# Patient Record
Sex: Male | Born: 1955
Health system: Southern US, Community
[De-identification: ages and names within clinical notes are randomized; demographics above are authoritative.]

## PROBLEM LIST (undated history)

## (undated) DIAGNOSIS — L111 Transient acantholytic dermatosis [Grover]: Secondary | ICD-10-CM

## (undated) DIAGNOSIS — M1990 Unspecified osteoarthritis, unspecified site: Secondary | ICD-10-CM

## (undated) DIAGNOSIS — Z22322 Carrier or suspected carrier of Methicillin resistant Staphylococcus aureus: Secondary | ICD-10-CM

## (undated) DIAGNOSIS — R112 Nausea with vomiting, unspecified: Secondary | ICD-10-CM

## (undated) DIAGNOSIS — K589 Irritable bowel syndrome without diarrhea: Secondary | ICD-10-CM

## (undated) DIAGNOSIS — K219 Gastro-esophageal reflux disease without esophagitis: Secondary | ICD-10-CM

## (undated) DIAGNOSIS — Z9889 Other specified postprocedural states: Secondary | ICD-10-CM

## (undated) DIAGNOSIS — F419 Anxiety disorder, unspecified: Secondary | ICD-10-CM

## (undated) HISTORY — PX: APPENDECTOMY: SHX54

## (undated) HISTORY — DX: Irritable bowel syndrome, unspecified: K58.9

## (undated) HISTORY — DX: Gastro-esophageal reflux disease without esophagitis: K21.9

## (undated) HISTORY — PX: KNEE SURGERY: SHX244

---

## 2011-08-12 HISTORY — PX: COLONOSCOPY: SHX174

## 2012-05-20 DIAGNOSIS — N50819 Testicular pain, unspecified: Secondary | ICD-10-CM | POA: Insufficient documentation

## 2014-02-28 ENCOUNTER — Ambulatory Visit: Payer: Self-pay

## 2014-03-08 ENCOUNTER — Ambulatory Visit (INDEPENDENT_AMBULATORY_CARE_PROVIDER_SITE_OTHER): Payer: BC Managed Care – PPO

## 2014-03-08 VITALS — BP 144/93 | HR 75 | Resp 17 | Ht 72.0 in | Wt 188.0 lb

## 2014-03-08 DIAGNOSIS — M778 Other enthesopathies, not elsewhere classified: Secondary | ICD-10-CM

## 2014-03-08 DIAGNOSIS — M21619 Bunion of unspecified foot: Secondary | ICD-10-CM

## 2014-03-08 DIAGNOSIS — Q828 Other specified congenital malformations of skin: Secondary | ICD-10-CM

## 2014-03-08 DIAGNOSIS — M779 Enthesopathy, unspecified: Secondary | ICD-10-CM

## 2014-03-08 DIAGNOSIS — M21629 Bunionette of unspecified foot: Secondary | ICD-10-CM

## 2014-03-08 DIAGNOSIS — G5761 Lesion of plantar nerve, right lower limb: Secondary | ICD-10-CM

## 2014-03-08 DIAGNOSIS — M7750 Other enthesopathy of unspecified foot: Secondary | ICD-10-CM

## 2014-03-08 DIAGNOSIS — G5760 Lesion of plantar nerve, unspecified lower limb: Secondary | ICD-10-CM

## 2014-03-08 MED ORDER — MELOXICAM 15 MG PO TABS: 15.0000 mg | ORAL_TABLET | Freq: Every day | ORAL | Status: DC

## 2014-03-08 NOTE — Progress Notes (Signed)
   Subjective:    Patient ID: Micheal May, male    DOB: 1956-03-28, 58 y.o.   MRN: [id number]  HPI Comments: N bunions L B/L 5th MPJ D April of this year O narrow shoes C pain, enlarged areas and hard cores A enclosed shoes, weightbearing and pressure T warm soaks  Pt states he has had a burning pain that radiates from plantar right 4th MPJ to the top of his foot for 3 years.     Review of Systems  All other systems reviewed and are negative.      Objective:   Physical Exam 58 year old white male well-developed well-nourished oriented x3 presents at this time with complaints of pain lateral aspects of both feet with associated keratoses and bony prominence/tailor bunion deformity as well as possibly a tensioner for pain in the third and fourth interspace areas on the right foot. Lower extremity objective findings reveal intact neurovascular status pedal pulses are palpable bilateral DP and PT +2/4 bilateral capillary refill time 3 seconds epicritic and proprioceptive sensations intact and symmetric bilateral there is normal plantar response DTRs not elicited dermatologically skin color pigment and hair growth are normal there is a keratoses lateral aspect fifth digit MTP joint bilateral consistent with porokeratosis versus verrucoid type lesion.. Patient also is pain on compression third fourth interspace right foot left foot is asymptomatic on compression of posse positive Mulder sign on right foot third interspace. Some tenderness in the fourth interspace also noted is patient does have some possible bursitis of the fifth MTP joint bilateral. No open wounds ulcerations no secondary infections no history of injury or trauma       Assessment & Plan:  Assessment this time is tailor bunion deformity bilateral with associated bursitis poor keratotic lesion fifth digits bilateral the MTP joints lesions are debrided of some Neosporin applied and recommended wider straight lash shoes to  avoid compression of these areas patient was demonstrated that there was a difference between a straight and a curved last. The keratotic lesion is debrided may recommendations for wide straight shoes also recommended ice to the area and a prescription for [facility] is given a milligrams once daily as needed for pain. As far as the neuroma symptomology third and fourth interspace maintain wide shoe in the NSAID if no improvement within the next month followup for more aggressive possible steroid injection treatment. Should note that patient's current shoes he is wearing fit comfortably have a very wide straight last however his other dry shoes causing discomfort the last surgical last alternative for treatment would be surgical intervention however we'll tried changing shoes prior to surgery reappointed as needed  Harriet Masson DPM

## 2014-03-08 NOTE — Patient Instructions (Addendum)
Bunion Care  A bunion is a boney protrusion at the base of your big toe (metatarsal-phalangeal joint). This problem, if painful or troublesome can be corrected with surgery. This is an elective surgery, so you can pick a convenient time for the procedure. The surgery may:  · Improve appearance (cosmetic).  · Relieve pain.  · Improve function.  Your foot is made up of a complex set of twenty-six bones which are held together by tough fibrous ligaments. The movement of the foot is controlled by muscles in the foot and leg. These muscles attach to the foot by cord like structures (tendons) that attach muscle to bone.  If surgery is recommended, your caregiver will explain your foot problem and how surgery can improve it. Your caregiver can answer questions you may have about the potential risks and complications involved. After determining that foot surgery is necessary to correct your problem, you can proceed with plans for the surgery.  LET YOUR CAREGIVER KNOW ABOUT:   · Previous problems with anesthetics or medicines used to numb the skin.  · Allergies to dyes, iodine, foods, and/or latex.  · Medicines taken including herbs, eye drops, prescription medicines (especially medicines used to "thin the blood"), aspirin and other over-the-counter medicines, and steroids (by mouth or as a cream).  · History of bleeding or blood problems.  · Possibility of pregnancy, if this applies.  · History of blood clots in your legs and/or lungs.  · Previous surgery.  · Other important health problems.  Let your caregiver know about health changes prior to surgery.  BEFORE THE PROCEDURE   You should be present 60 minutes prior to your procedure or as directed.   PROCEDURE  BUNION TYPES AND THEIR TREATMENTS  · Positional Bunion. A positional bunion develops when a bony growth on the side of the metatarsal bone enlarges the joint. The metatarsal forces the joint capsule to stretch over it. As this growth pushes the big toe toward the  others, the tendons on the inside tighten. This forces the big toe farther out of alignment. The bunion presses against the shoe, irritating the skin and causes further pain and disability.  ¨ Positional Bunionectomy Treatment. The bunion is removed. Tight tendons may be released.  ¨ Follow-up Care. Your toe is apt to be stiff at first but will loosen up as you move it. You may need to wear a special surgical shoe and, possibly, a splint for about three weeks.  · Mild Structural Bunion. Structural bunions occur when the angle between the first and second metatarsal bones increases to a point where it is greater than normal. The increased angle of the metatarsals makes the big toe slant toward the other toes. Sometimes bony growths may form. Irritation and swelling often follow.  ¨ Structural Bunionectomy Treatment. Your caregiver surgically repositions the bone by decreasing the angle and may use a fixation device to hold it together. The bunion (bump) is also removed.  · Degenerative Joint Disease (Arthritis). When wear-and-tear arthritis (osteoarthritis) of aging affects the big toe joint, pain and reduced joint motion may result. This is not a true bunion but may be associated with bunions. Left untreated, it can increase wear and tear in the joint and break down the cartilage. Pain and stiffness are problems of both wear-and-tear arthritis and rheumatoid arthritis.  · Arthroplasty With Joint Implantation As Treatment. The bunion is first removed; then the degenerated joint is removed and replaced with an implant.  AFTER THE PROCEDURE     is back to normal strength along with a return of nearly normal function. It is best to do elective surgeries when your health is optimal.  After surgery, you will be taken to the recovery area where a nurse will watch and  check your progress. Once you are awake, stable, and taking fluids well, barring other problems you will be allowed to go home. HOME CARE INSTRUCTIONS  Be sure to ask your caregiver how long you will be off your feet and home from work. Plan accordingly. There are several types of bunions and varying surgical treatments for each. Common types are explained above. Your surgery may be similar and may include a fixation device (such as a small screw). Your foot and ankle may be immobilized by a cast (from your toes to below your knee). You may be asked not to bear weight on this foot for a few weeks or until comfortable. Once home, an ice pack applied to your operative site may help with discomfort and keep the swelling down. You may be able to walk a day or two after surgery. Your podiatrist may prescribe a splint or a special shoe to be worn for several weeks. Only take over-the-counter or prescription medicines for pain, discomfort, or fever as directed by your caregiver.  SEEK MEDICAL CARE IF:   There is increased bleeding (more than a small spot) from the surgical site.  You notice redness, swelling, or increasing pain in the surgical site.  Pus is coming from the site.  An unexplained oral temperature above 102 F (38.9 C) develops.  You notice a foul smell coming from the surgical site or dressing. SEEK IMMEDIATE MEDICAL CARE IF:  You develop a rash, have difficulty breathing, or have any allergic problems with medications. Document Released: 07/25/2000 Document Revised: 10/20/2011 Document Reviewed: 07/31/2008 [facility] Patient Information 2015 [place], [place]. This information is not intended to replace advice given to you by your health care provider. Make sure you discuss any questions you have with your health care provider.     Tailor's bunion is basically a smaller version of the great toe joint bunion in a miror image appearance. Rather than prominence of the great toe joint the  fifth MTP joint is prominent laterally as well as on the bottom of foot. Often associated with a separation of the fifth metatarsal from the adjacent metatarsals and enlargement of the metatarsal head itself.  Also recommendations at this time is to maintain a straight rather than curved last shoe. Also consider getting a shoe with a wide rather than medium width. If symptoms persist both of the bunions as well as the patient are symptoms followup with the next month or 2 for more aggressive options.

## 2015-11-19 DIAGNOSIS — M1711 Unilateral primary osteoarthritis, right knee: Secondary | ICD-10-CM | POA: Diagnosis not present

## 2015-12-11 ENCOUNTER — Other Ambulatory Visit: Payer: Self-pay | Admitting: Orthopedic Surgery

## 2015-12-11 DIAGNOSIS — M1711 Unilateral primary osteoarthritis, right knee: Secondary | ICD-10-CM | POA: Diagnosis not present

## 2016-01-08 DIAGNOSIS — M25561 Pain in right knee: Secondary | ICD-10-CM | POA: Diagnosis not present

## 2016-01-22 NOTE — Progress Notes (Addendum)
Anesthesia Chart Review: Patient is a 60 year old male scheduled for right TKA on 02/08/16 (first case) by Dr. Berenice Primas. Anesthesia is posted for Choice.  History includes non-smoker, post-operative N/V, anxiety, GERD, MRSA (facial folliculitis), Grover's disease (transient acantholytic dermatosis), appendectomy '76, arthroscopic knee surgery '93 and '99, left variocele, prostatits '13. PCP is Dr. Velna Hatchet.   Per PAT RN, no CP, SOB, personal or family history of anesthesia complications (other than post-operative N/V.)   Meds include Vitamin C, Lexapro, Prilosec, Valtrex, Ambien.  PAT Vitals: BP 123/76, HR 79, RR 20, T 36.3C, O2 sat 97%. BMI 26.55.  12/06/08 Stress echo ([facility]; see Care Everywhere; ordered by PCP at that time Dr. Ned Clines, I believe due to FHx of GF MI at age 52): STRESS ECG RESULTS ----------------------------------------------------------- ECG Results: POSITIVE, 1 mm INFERIOR ST-SEGMENT DEPRESSION INTERPRETATION --------------------------------------------------------------- Interpretation: Normal Stress Echocardiogram. Note: POSITIVE ECG WITHOUT WALL MOTION ABNORMALITY TRIVIAL PR, TR, MR NORMAL DIASTOLIC FUNCTION (Stress test results reviewed with anesthesiologist Dr. Therisa Doyne prior to patient's PAT visit. No new recommendations if no recent cardiopulmonary issues.)  01/28/16 EKG: NSR, incomplete right BBB. Currently, no previous tracing available.   01/28/16 CXR: IMPRESSION: 1. Questionable nodular density is noted over the anterior chest on lateral view only. Contrast-enhanced chest CT is suggested for further evaluation. 2. Mild cardiomegaly with normal pulmonary vascularity.  Preoperative labs noted.   I left a voice message with Elmyra Ricks at Dr. Berenice Primas' office regarding CXR results. Would defer timing of further evaluation of possible chest nodule to Dr. Berenice Primas (or he can defer to Dr. Ardeth Perfect). If no acute changes then I would anticipate that he could  proceed as planned. (Update 01/29/16 5:19 PM: Elmyra Ricks called earlier today to confirm she had received my voice message regarding abnormal CXR. She will review with Dr. Berenice Primas for recommendations.)  Micheal May [facility] Short Stay Center/Anesthesiology Phone [phone] 01/28/2016 4:56 PM

## 2016-01-28 ENCOUNTER — Encounter (HOSPITAL_COMMUNITY): Payer: Self-pay

## 2016-01-28 ENCOUNTER — Encounter (HOSPITAL_COMMUNITY)
Admission: RE | Admit: 2016-01-28 | Discharge: 2016-01-28 | Disposition: A | Discharge status: Home / Self Care | Payer: BLUE CROSS/BLUE SHIELD | Source: Ambulatory Visit | Attending: Orthopedic Surgery | Admitting: Orthopedic Surgery

## 2016-01-28 ENCOUNTER — Ambulatory Visit (HOSPITAL_COMMUNITY)
Admission: RE | Admit: 2016-01-28 | Discharge: 2016-01-28 | Disposition: A | Discharge status: Home / Self Care | Payer: BLUE CROSS/BLUE SHIELD | Source: Ambulatory Visit | Attending: Orthopedic Surgery | Admitting: Orthopedic Surgery

## 2016-01-28 DIAGNOSIS — I517 Cardiomegaly: Secondary | ICD-10-CM | POA: Diagnosis not present

## 2016-01-28 DIAGNOSIS — Z01812 Encounter for preprocedural laboratory examination: Secondary | ICD-10-CM | POA: Diagnosis not present

## 2016-01-28 DIAGNOSIS — Z01818 Encounter for other preprocedural examination: Secondary | ICD-10-CM | POA: Insufficient documentation

## 2016-01-28 DIAGNOSIS — Z01810 Encounter for preprocedural cardiovascular examination: Secondary | ICD-10-CM | POA: Diagnosis not present

## 2016-01-28 DIAGNOSIS — R222 Localized swelling, mass and lump, trunk: Secondary | ICD-10-CM | POA: Diagnosis not present

## 2016-01-28 HISTORY — DX: Transient acantholytic dermatosis (grover): L11.1

## 2016-01-28 HISTORY — DX: Other specified postprocedural states: Z98.890

## 2016-01-28 HISTORY — DX: Nausea with vomiting, unspecified: R11.2

## 2016-01-28 HISTORY — DX: Carrier or suspected carrier of methicillin resistant Staphylococcus aureus: Z22.322

## 2016-01-28 HISTORY — DX: Anxiety disorder, unspecified: F41.9

## 2016-01-28 LAB — CBC WITH DIFFERENTIAL/PLATELET
BASOS PCT: 0 %
Basophils Absolute: 0 10*3/uL (ref 0.0–0.1)
EOS PCT: 2 %
Eosinophils Absolute: 0.1 10*3/uL (ref 0.0–0.7)
HEMATOCRIT: 42.3 % (ref 39.0–52.0)
Hemoglobin: 14.8 g/dL (ref 13.0–17.0)
Lymphocytes Relative: 18 %
Lymphs Abs: 0.8 10*3/uL (ref 0.7–4.0)
MCH: 31.8 pg (ref 26.0–34.0)
MCHC: 35 g/dL (ref 30.0–36.0)
MCV: 90.8 fL (ref 78.0–100.0)
MONO ABS: 0.4 10*3/uL (ref 0.1–1.0)
MONOS PCT: 9 %
NEUTROS ABS: 3.2 10*3/uL (ref 1.7–7.7)
Neutrophils Relative %: 71 %
PLATELETS: 154 10*3/uL (ref 150–400)
RBC: 4.66 MIL/uL (ref 4.22–5.81)
RDW: 12.8 % (ref 11.5–15.5)
WBC: 4.5 10*3/uL (ref 4.0–10.5)

## 2016-01-28 LAB — COMPREHENSIVE METABOLIC PANEL
ALBUMIN: 4.1 g/dL (ref 3.5–5.0)
ALT: 34 U/L (ref 17–63)
ANION GAP: 6 (ref 5–15)
AST: 20 U/L (ref 15–41)
Alkaline Phosphatase: 70 U/L (ref 38–126)
BILIRUBIN TOTAL: 0.9 mg/dL (ref 0.3–1.2)
BUN: 16 mg/dL (ref 6–20)
CHLORIDE: 106 mmol/L (ref 101–111)
CO2: 27 mmol/L (ref 22–32)
Calcium: 9.4 mg/dL (ref 8.9–10.3)
Creatinine, Ser: 0.87 mg/dL (ref 0.61–1.24)
GFR calc Af Amer: >60 mL/min (ref >60)
Glucose, Bld: 110 mg/dL — ABNORMAL HIGH (ref 65–99)
POTASSIUM: 4 mmol/L (ref 3.5–5.1)
Sodium: 139 mmol/L (ref 135–145)
TOTAL PROTEIN: 6.6 g/dL (ref 6.5–8.1)

## 2016-01-28 LAB — URINALYSIS, ROUTINE W REFLEX MICROSCOPIC
BILIRUBIN URINE: NEGATIVE
GLUCOSE, UA: NEGATIVE mg/dL
HGB URINE DIPSTICK: NEGATIVE
Ketones, ur: NEGATIVE mg/dL
Leukocytes, UA: NEGATIVE
Nitrite: NEGATIVE
PH: 5.5 (ref 5.0–8.0)
Protein, ur: NEGATIVE mg/dL
SPECIFIC GRAVITY, URINE: 1.029 (ref 1.005–1.030)

## 2016-01-28 LAB — ABO/RH: ABO/RH(D): A NEG

## 2016-01-28 LAB — SURGICAL PCR SCREEN
MRSA, PCR: NEGATIVE
STAPHYLOCOCCUS AUREUS: NEGATIVE

## 2016-01-28 LAB — PROTIME-INR
INR: 1.18 (ref 0.00–1.49)
PROTHROMBIN TIME: 15.2 s (ref 11.6–15.2)

## 2016-01-28 LAB — APTT: APTT: 28 s (ref 24–37)

## 2016-01-28 NOTE — Pre-Procedure Instructions (Signed)
    Micheal May  01/28/2016      [id number] - [place], [address] [place] [address] Phone: [phone] Fax: [phone]    Your procedure is scheduled on 02/08/16.  Report to [facility] Admitting at 530 A.M.  Call this number if you have problems the morning of surgery:  [phone]   Remember:  Do not eat food or drink liquids after midnight.  Take these medicines the morning of surgery with A SIP OF WATER lexapro,prilosec,valtrex Do not take any aspirin,anti-inflammatories,vitamins,or herbal supplements 5-7 days prior to surgery.  Do not wear jewelry, make-up or nail polish.  Do not wear lotions, powders, or perfumes.  You may wear deoderant.  Do not shave 48 hours prior to surgery.  Men may shave face and neck.  Do not bring valuables to the hospital.  [facility] is not responsible for any belongings or valuables.  Contacts, dentures or bridgework may not be worn into surgery.  Leave your suitcase in the car.  After surgery it may be brought to your room.  For patients admitted to the hospital, discharge time will be determined by your treatment team.  Patients discharged the day of surgery will not be allowed to drive home.   Name and phone number of your driver:   Special instructions:    Please read over the following fact sheets that you were given. MRSA Information

## 2016-01-31 LAB — TYPE AND SCREEN
ABO/RH(D): A NEG
ANTIBODY SCREEN: NEGATIVE

## 2016-02-05 MED ORDER — VANCOMYCIN HCL IN DEXTROSE 1-5 GM/200ML-% IV SOLN
1000.0000 mg | Freq: Once | INTRAVENOUS | Status: AC
  Administered 2016-02-06: 1000 mg via INTRAVENOUS
  Filled 2016-02-05: qty 200

## 2016-02-05 MED ORDER — CEFAZOLIN SODIUM-DEXTROSE 2-4 GM/100ML-% IV SOLN
2.0000 g | INTRAVENOUS | Status: DC
  Filled 2016-02-05: qty 100

## 2016-02-05 NOTE — Progress Notes (Signed)
Left message on pt's voicemail informing him of surgery date and time change. Instructed pt to be here tomorrow, 02/06/16 at 6:30 AM.

## 2016-02-05 NOTE — H&P (Signed)
TOTAL KNEE ADMISSION H&P  Patient is being admitted for right total knee arthroplasty.  Subjective:  Chief Complaint:right knee pain.  HPI: Micheal May, 60 y.o. male, has a history of pain and functional disability in the right knee due to arthritis and has failed non-surgical conservative treatments for greater than 12 weeks to includeNSAID's and/or analgesics, corticosteriod injections, viscosupplementation injections, flexibility and strengthening excercises and activity modification.  Onset of symptoms was gradual, starting 5 years ago with gradually worsening course since that time. The patient noted no past surgery on the right knee(s).  Patient currently rates pain in the right knee(s) at 8 out of 10 with activity. Patient has night pain, worsening of pain with activity and weight bearing, pain that interferes with activities of daily living, pain with passive range of motion, crepitus and joint swelling.  Patient has evidence of subchondral sclerosis, periarticular osteophytes and joint space narrowing by imaging studies. This patient has had failure of all reasonable conservative care. There is no active infection.  There are no active problems to display for this patient.  Past Medical History  Diagnosis Date  . GERD (gastroesophageal reflux disease)   . PONV (postoperative nausea and vomiting)   . Anxiety   . Grover's disease   . MRSA (methicillin resistant staph aureus) culture positive     hx of due to ingrown hair    10  yrs ago    Past Surgical History  Procedure Laterality Date  . Appendectomy    . Knee surgery Bilateral     No prescriptions prior to admission   No Known Allergies  Social History  Substance Use Topics  . Smoking status: Never Smoker   . Smokeless tobacco: Not on file  . Alcohol Use: Yes     Comment: weekly    Family History  Problem Relation Age of Onset  . Hypertension Mother      ROS ROS: I have reviewed the patient's review of systems  thoroughly and there are no positive responses as relates to the HPI. Objective:  Physical Exam  Vital signs in last 24 hours:  Well-developed well-nourished patient in no acute distress. Alert and oriented x3 HEENT:within normal limits Cardiac: Regular rate and rhythm Pulmonary: Lungs clear to auscultation Abdomen: Soft and nontender.  Normal active bowel sounds  Musculoskeletal: (right knee: Painful range of motion.  Limited range of motion.  No instability.  Trace effusion. Labs: Recent Results (from the past 2160 hour(s))  APTT     Status: None   Collection Time: 01/28/16  9:26 AM  Result Value Ref Range   aPTT 28 24 - 37 seconds  CBC WITH DIFFERENTIAL     Status: None   Collection Time: 01/28/16  9:26 AM  Result Value Ref Range   WBC 4.5 4.0 - 10.5 K/uL   RBC 4.66 4.22 - 5.81 MIL/uL   Hemoglobin 14.8 13.0 - 17.0 g/dL   HCT 42.3 39.0 - 52.0 %   MCV 90.8 78.0 - 100.0 fL   MCH 31.8 26.0 - 34.0 pg   MCHC 35.0 30.0 - 36.0 g/dL   RDW 12.8 11.5 - 15.5 %   Platelets 154 150 - 400 K/uL   Neutrophils Relative % 71 %   Neutro Abs 3.2 1.7 - 7.7 K/uL   Lymphocytes Relative 18 %   Lymphs Abs 0.8 0.7 - 4.0 K/uL   Monocytes Relative 9 %   Monocytes Absolute 0.4 0.1 - 1.0 K/uL   Eosinophils Relative 2 %  Eosinophils Absolute 0.1 0.0 - 0.7 K/uL   Basophils Relative 0 %   Basophils Absolute 0.0 0.0 - 0.1 K/uL  Comprehensive metabolic panel     Status: Abnormal   Collection Time: 01/28/16  9:26 AM  Result Value Ref Range   Sodium 139 135 - 145 mmol/L   Potassium 4.0 3.5 - 5.1 mmol/L   Chloride 106 101 - 111 mmol/L   CO2 27 22 - 32 mmol/L   Glucose, Bld 110 (H) 65 - 99 mg/dL   BUN 16 6 - 20 mg/dL   Creatinine, Ser 0.87 0.61 - 1.24 mg/dL   Calcium 9.4 8.9 - 10.3 mg/dL   Total Protein 6.6 6.5 - 8.1 g/dL   Albumin 4.1 3.5 - 5.0 g/dL   AST 20 15 - 41 U/L   ALT 34 17 - 63 U/L   Alkaline Phosphatase 70 38 - 126 U/L   Total Bilirubin 0.9 0.3 - 1.2 mg/dL   GFR calc non Af Amer  >60 >60 mL/min   GFR calc Af Amer >60 >60 mL/min    Comment: (NOTE) The eGFR has been calculated using the CKD EPI equation. This calculation has not been validated in all clinical situations. eGFR's persistently <60 mL/min signify possible Chronic Kidney Disease.    Anion gap 6 5 - 15  Protime-INR     Status: None   Collection Time: 01/28/16  9:26 AM  Result Value Ref Range   Prothrombin Time 15.2 11.6 - 15.2 seconds   INR 1.18 0.00 - 1.49  Urinalysis, Routine w reflex microscopic (not at [facility])     Status: None   Collection Time: 01/28/16  9:26 AM  Result Value Ref Range   Color, Urine YELLOW YELLOW   APPearance CLEAR CLEAR   Specific Gravity, Urine 1.029 1.005 - 1.030   pH 5.5 5.0 - 8.0   Glucose, UA NEGATIVE NEGATIVE mg/dL   Hgb urine dipstick NEGATIVE NEGATIVE   Bilirubin Urine NEGATIVE NEGATIVE   Ketones, ur NEGATIVE NEGATIVE mg/dL   Protein, ur NEGATIVE NEGATIVE mg/dL   Nitrite NEGATIVE NEGATIVE   Leukocytes, UA NEGATIVE NEGATIVE    Comment: MICROSCOPIC NOT DONE ON URINES WITH NEGATIVE PROTEIN, BLOOD, LEUKOCYTES, NITRITE, OR GLUCOSE <1000 mg/dL.  Surgical pcr screen     Status: None   Collection Time: 01/28/16  9:26 AM  Result Value Ref Range   MRSA, PCR NEGATIVE NEGATIVE   Staphylococcus aureus NEGATIVE NEGATIVE    Comment:        The Xpert SA Assay (FDA approved for NASAL specimens in patients over 18 years of age), is one component of a comprehensive surveillance program.  Test performance has been validated by [facility] for patients greater than or equal to 56 year old. It is not intended to diagnose infection nor to guide or monitor treatment.   Type and screen Order type and screen if day of surgery is less than 15 days from draw of preadmission visit or order morning of surgery if day of surgery is greater than 6 days from preadmission visit.     Status: None   Collection Time: 01/28/16  9:40 AM  Result Value Ref Range   ABO/RH(D) A NEG    Antibody  Screen NEG    Sample Expiration 02/11/2016    Extend sample reason NO TRANSFUSIONS OR PREGNANCY IN THE PAST 3 MONTHS   ABO/Rh     Status: None   Collection Time: 01/28/16  9:40 AM  Result Value Ref Range  ABO/RH(D) A NEG    Antibody Identification ANTI I     Estimated body mass index is 25.49 kg/(m^2) as calculated from the following:   Height as of 03/08/14: 6' (1.829 m).   Weight as of 03/08/14: 85.276 kg (188 lb).   Imaging Review Plain radiographs demonstrate severe degenerative joint disease of the right knee(s). The overall alignment ismild varus. The bone quality appears to be good for age and reported activity level.  Assessment/Plan:  End stage arthritis, right knee   The patient history, physical examination, clinical judgment of the provider and imaging studies are consistent with end stage degenerative joint disease of the right knee(s) and total knee arthroplasty is deemed medically necessary. The treatment options including medical management, injection therapy arthroscopy and arthroplasty were discussed at length. The risks and benefits of total knee arthroplasty were presented and reviewed. The risks due to aseptic loosening, infection, stiffness, patella tracking problems, thromboembolic complications and other imponderables were discussed. The patient acknowledged the explanation, agreed to proceed with the plan and consent was signed. Patient is being admitted for inpatient treatment for surgery, pain control, PT, OT, prophylactic antibiotics, VTE prophylaxis, progressive ambulation and ADL's and discharge planning. The patient is planning to be discharged home with home health services

## 2016-02-06 ENCOUNTER — Encounter (HOSPITAL_COMMUNITY): Payer: Self-pay | Admitting: Anesthesiology

## 2016-02-06 ENCOUNTER — Encounter (HOSPITAL_COMMUNITY)
Admission: RE | Disposition: A | Discharge status: Home Health | Payer: Self-pay | Source: Ambulatory Visit | Attending: Orthopedic Surgery

## 2016-02-06 ENCOUNTER — Inpatient Hospital Stay (HOSPITAL_COMMUNITY): Payer: BLUE CROSS/BLUE SHIELD | Admitting: Anesthesiology

## 2016-02-06 ENCOUNTER — Inpatient Hospital Stay (HOSPITAL_COMMUNITY)
Admission: RE | Admit: 2016-02-06 | Discharge: 2016-02-08 | DRG: 470 | Disposition: A | Discharge status: Home Health | Payer: BLUE CROSS/BLUE SHIELD | Source: Ambulatory Visit | Attending: Orthopedic Surgery | Admitting: Orthopedic Surgery

## 2016-02-06 ENCOUNTER — Inpatient Hospital Stay (HOSPITAL_COMMUNITY): Payer: BLUE CROSS/BLUE SHIELD | Admitting: Vascular Surgery

## 2016-02-06 DIAGNOSIS — M1711 Unilateral primary osteoarthritis, right knee: Secondary | ICD-10-CM | POA: Diagnosis not present

## 2016-02-06 DIAGNOSIS — Z8249 Family history of ischemic heart disease and other diseases of the circulatory system: Secondary | ICD-10-CM

## 2016-02-06 DIAGNOSIS — R911 Solitary pulmonary nodule: Secondary | ICD-10-CM

## 2016-02-06 DIAGNOSIS — Z8614 Personal history of Methicillin resistant Staphylococcus aureus infection: Secondary | ICD-10-CM | POA: Diagnosis not present

## 2016-02-06 DIAGNOSIS — M179 Osteoarthritis of knee, unspecified: Secondary | ICD-10-CM | POA: Diagnosis not present

## 2016-02-06 DIAGNOSIS — K219 Gastro-esophageal reflux disease without esophagitis: Secondary | ICD-10-CM | POA: Diagnosis not present

## 2016-02-06 DIAGNOSIS — Z79899 Other long term (current) drug therapy: Secondary | ICD-10-CM

## 2016-02-06 HISTORY — PX: TOTAL KNEE ARTHROPLASTY: SHX125

## 2016-02-06 HISTORY — DX: Unspecified osteoarthritis, unspecified site: M19.90

## 2016-02-06 SURGERY — ARTHROPLASTY, KNEE, TOTAL
Anesthesia: Spinal | Site: Knee | Laterality: Right

## 2016-02-06 MED ORDER — ZOLPIDEM TARTRATE 5 MG PO TABS
10.0000 mg | ORAL_TABLET | Freq: Every evening | ORAL | Status: DC | PRN
  Administered 2016-02-07 (×2): 10 mg via ORAL
  Filled 2016-02-06 (×2): qty 2

## 2016-02-06 MED ORDER — BUPIVACAINE HCL (PF) 0.5 % IJ SOLN
INTRAMUSCULAR | Status: DC | PRN
  Administered 2016-02-06: 20 mL

## 2016-02-06 MED ORDER — VANCOMYCIN HCL IN DEXTROSE 1-5 GM/200ML-% IV SOLN: 1000.0000 mg | Freq: Two times a day (BID) | INTRAVENOUS | Status: DC

## 2016-02-06 MED ORDER — MIDAZOLAM HCL 2 MG/2ML IJ SOLN
INTRAMUSCULAR | Status: AC
  Filled 2016-02-06: qty 2

## 2016-02-06 MED ORDER — TRANEXAMIC ACID 1000 MG/10ML IV SOLN
1000.0000 mg | Freq: Once | INTRAVENOUS | Status: AC
  Administered 2016-02-06: 1000 mg via INTRAVENOUS
  Filled 2016-02-06: qty 10

## 2016-02-06 MED ORDER — POLYETHYLENE GLYCOL 3350 17 G PO PACK: 17.0000 g | PACK | Freq: Every day | ORAL | Status: DC | PRN

## 2016-02-06 MED ORDER — PROPOFOL 10 MG/ML IV BOLUS
INTRAVENOUS | Status: DC | PRN
  Administered 2016-02-06: 30 mg via INTRAVENOUS
  Administered 2016-02-06: 20 mg via INTRAVENOUS

## 2016-02-06 MED ORDER — DIPHENHYDRAMINE HCL 12.5 MG/5ML PO ELIX: 12.5000 mg | ORAL_SOLUTION | ORAL | Status: DC | PRN

## 2016-02-06 MED ORDER — LIDOCAINE HCL (CARDIAC) 20 MG/ML IV SOLN
INTRAVENOUS | Status: DC | PRN
  Administered 2016-02-06 (×2): 50 mg via INTRAVENOUS

## 2016-02-06 MED ORDER — ACETAMINOPHEN 650 MG RE SUPP: 650.0000 mg | Freq: Four times a day (QID) | RECTAL | Status: DC | PRN

## 2016-02-06 MED ORDER — SODIUM CHLORIDE 0.9 % IR SOLN
Status: DC | PRN
  Administered 2016-02-06: 3000 mL

## 2016-02-06 MED ORDER — HYDROMORPHONE HCL 1 MG/ML IJ SOLN
0.5000 mg | INTRAMUSCULAR | Status: DC | PRN
  Administered 2016-02-06 (×2): 0.5 mg via INTRAVENOUS

## 2016-02-06 MED ORDER — ONDANSETRON HCL 4 MG/2ML IJ SOLN
INTRAMUSCULAR | Status: DC | PRN
  Administered 2016-02-06: 4 mg via INTRAVENOUS

## 2016-02-06 MED ORDER — OXYCODONE HCL 5 MG PO TABS
ORAL_TABLET | ORAL | Status: AC
  Filled 2016-02-06: qty 1

## 2016-02-06 MED ORDER — ONDANSETRON HCL 4 MG PO TABS
4.0000 mg | ORAL_TABLET | Freq: Four times a day (QID) | ORAL | Status: DC | PRN
  Administered 2016-02-08: 4 mg via ORAL
  Filled 2016-02-06: qty 1

## 2016-02-06 MED ORDER — DOCUSATE SODIUM 100 MG PO CAPS
100.0000 mg | ORAL_CAPSULE | Freq: Two times a day (BID) | ORAL | Status: DC
  Administered 2016-02-06 – 2016-02-08 (×4): 100 mg via ORAL
  Filled 2016-02-06 (×4): qty 1

## 2016-02-06 MED ORDER — BUPIVACAINE LIPOSOME 1.3 % IJ SUSP
20.0000 mL | Freq: Once | INTRAMUSCULAR | Status: AC
  Administered 2016-02-06: 20 mL
  Filled 2016-02-06: qty 20

## 2016-02-06 MED ORDER — VALACYCLOVIR HCL 500 MG PO TABS
500.0000 mg | ORAL_TABLET | Freq: Every day | ORAL | Status: DC
  Administered 2016-02-07 – 2016-02-08 (×2): 500 mg via ORAL
  Filled 2016-02-06 (×2): qty 1

## 2016-02-06 MED ORDER — PROPOFOL 10 MG/ML IV BOLUS
INTRAVENOUS | Status: AC
  Filled 2016-02-06: qty 40

## 2016-02-06 MED ORDER — LACTATED RINGERS IV SOLN
INTRAVENOUS | Status: DC | PRN
  Administered 2016-02-06 (×2): via INTRAVENOUS

## 2016-02-06 MED ORDER — METHOCARBAMOL 750 MG PO TABS: 750.0000 mg | ORAL_TABLET | Freq: Three times a day (TID) | ORAL | Status: DC | PRN

## 2016-02-06 MED ORDER — PROPOFOL 500 MG/50ML IV EMUL
INTRAVENOUS | Status: DC | PRN
  Administered 2016-02-06: 50 ug/kg/min via INTRAVENOUS

## 2016-02-06 MED ORDER — OXYCODONE-ACETAMINOPHEN 5-325 MG PO TABS: 1.0000 | ORAL_TABLET | ORAL | Status: DC | PRN

## 2016-02-06 MED ORDER — ASPIRIN EC 325 MG PO TBEC: 325.0000 mg | DELAYED_RELEASE_TABLET | Freq: Two times a day (BID) | ORAL | Status: DC

## 2016-02-06 MED ORDER — OXYCODONE HCL 5 MG PO TABS
5.0000 mg | ORAL_TABLET | ORAL | Status: DC | PRN
  Administered 2016-02-06 (×2): 10 mg via ORAL
  Administered 2016-02-06: 5 mg via ORAL
  Administered 2016-02-07 – 2016-02-08 (×5): 10 mg via ORAL
  Filled 2016-02-06 (×7): qty 2

## 2016-02-06 MED ORDER — METHOCARBAMOL 500 MG PO TABS
ORAL_TABLET | ORAL | Status: AC
  Filled 2016-02-06: qty 1

## 2016-02-06 MED ORDER — ESCITALOPRAM OXALATE 10 MG PO TABS
10.0000 mg | ORAL_TABLET | Freq: Every day | ORAL | Status: DC
  Administered 2016-02-06 – 2016-02-07 (×2): 10 mg via ORAL
  Filled 2016-02-06 (×2): qty 1

## 2016-02-06 MED ORDER — BUPIVACAINE HCL (PF) 0.5 % IJ SOLN
INTRAMUSCULAR | Status: AC
  Filled 2016-02-06: qty 30

## 2016-02-06 MED ORDER — CEFAZOLIN SODIUM-DEXTROSE 2-3 GM-% IV SOLR
INTRAVENOUS | Status: DC | PRN
  Administered 2016-02-06: 2 g via INTRAVENOUS

## 2016-02-06 MED ORDER — DEXTROSE 5 % IV SOLN
INTRAVENOUS | Status: DC | PRN
  Administered 2016-02-06 (×2): via INTRAVENOUS

## 2016-02-06 MED ORDER — PANTOPRAZOLE SODIUM 40 MG PO TBEC
40.0000 mg | DELAYED_RELEASE_TABLET | Freq: Every day | ORAL | Status: DC
  Administered 2016-02-07 – 2016-02-08 (×2): 40 mg via ORAL
  Filled 2016-02-06 (×2): qty 1

## 2016-02-06 MED ORDER — METHOCARBAMOL 1000 MG/10ML IJ SOLN
500.0000 mg | Freq: Four times a day (QID) | INTRAVENOUS | Status: DC | PRN
  Filled 2016-02-06: qty 5

## 2016-02-06 MED ORDER — CHLORHEXIDINE GLUCONATE 4 % EX LIQD: 60.0000 mL | Freq: Once | CUTANEOUS | Status: DC

## 2016-02-06 MED ORDER — TRANEXAMIC ACID 1000 MG/10ML IV SOLN
1000.0000 mg | INTRAVENOUS | Status: AC
  Administered 2016-02-06: 1000 mg via INTRAVENOUS
  Filled 2016-02-06: qty 10

## 2016-02-06 MED ORDER — FENTANYL CITRATE (PF) 250 MCG/5ML IJ SOLN
INTRAMUSCULAR | Status: AC
  Filled 2016-02-06: qty 5

## 2016-02-06 MED ORDER — MIDAZOLAM HCL 5 MG/5ML IJ SOLN
INTRAMUSCULAR | Status: DC | PRN
  Administered 2016-02-06 (×3): 1 mg via INTRAVENOUS

## 2016-02-06 MED ORDER — ALUM & MAG HYDROXIDE-SIMETH 200-200-20 MG/5ML PO SUSP: 30.0000 mL | ORAL | Status: DC | PRN

## 2016-02-06 MED ORDER — SODIUM CHLORIDE 0.9 % IV SOLN: INTRAVENOUS | Status: DC | PRN

## 2016-02-06 MED ORDER — ONDANSETRON HCL 4 MG/2ML IJ SOLN: 4.0000 mg | Freq: Once | INTRAMUSCULAR | Status: DC | PRN

## 2016-02-06 MED ORDER — 0.9 % SODIUM CHLORIDE (POUR BTL) OPTIME
TOPICAL | Status: DC | PRN
  Administered 2016-02-06: 1000 mL

## 2016-02-06 MED ORDER — METHOCARBAMOL 500 MG PO TABS
500.0000 mg | ORAL_TABLET | Freq: Four times a day (QID) | ORAL | Status: DC | PRN
  Administered 2016-02-06 – 2016-02-08 (×6): 500 mg via ORAL
  Filled 2016-02-06 (×5): qty 1

## 2016-02-06 MED ORDER — MAGNESIUM CITRATE PO SOLN: 1.0000 | Freq: Once | ORAL | Status: DC | PRN

## 2016-02-06 MED ORDER — ASPIRIN EC 325 MG PO TBEC
325.0000 mg | DELAYED_RELEASE_TABLET | Freq: Two times a day (BID) | ORAL | Status: DC
  Administered 2016-02-06 – 2016-02-08 (×4): 325 mg via ORAL
  Filled 2016-02-06 (×4): qty 1

## 2016-02-06 MED ORDER — LIDOCAINE 2% (20 MG/ML) 5 ML SYRINGE
INTRAMUSCULAR | Status: AC
  Filled 2016-02-06: qty 5

## 2016-02-06 MED ORDER — HYDROMORPHONE HCL 1 MG/ML IJ SOLN: 1.0000 mg | INTRAMUSCULAR | Status: DC | PRN

## 2016-02-06 MED ORDER — ONDANSETRON HCL 4 MG/2ML IJ SOLN
4.0000 mg | Freq: Four times a day (QID) | INTRAMUSCULAR | Status: DC | PRN
  Administered 2016-02-06: 4 mg via INTRAVENOUS
  Filled 2016-02-06: qty 2

## 2016-02-06 MED ORDER — BISACODYL 5 MG PO TBEC
5.0000 mg | DELAYED_RELEASE_TABLET | Freq: Every day | ORAL | Status: DC | PRN
  Administered 2016-02-08: 5 mg via ORAL
  Filled 2016-02-06: qty 1

## 2016-02-06 MED ORDER — SODIUM CHLORIDE 0.9 % IV SOLN
INTRAVENOUS | Status: DC
  Administered 2016-02-06: 15:00:00 via INTRAVENOUS

## 2016-02-06 MED ORDER — FENTANYL CITRATE (PF) 100 MCG/2ML IJ SOLN
INTRAMUSCULAR | Status: DC | PRN
  Administered 2016-02-06: 25 ug via INTRAVENOUS
  Administered 2016-02-06 (×2): 50 ug via INTRAVENOUS

## 2016-02-06 MED ORDER — ONDANSETRON HCL 4 MG/2ML IJ SOLN
INTRAMUSCULAR | Status: AC
  Filled 2016-02-06: qty 2

## 2016-02-06 MED ORDER — SODIUM CHLORIDE 0.9 % IJ SOLN
INTRAMUSCULAR | Status: DC | PRN
  Administered 2016-02-06: 20 mL

## 2016-02-06 MED ORDER — CEFAZOLIN SODIUM-DEXTROSE 2-4 GM/100ML-% IV SOLN
2.0000 g | Freq: Four times a day (QID) | INTRAVENOUS | Status: AC
  Administered 2016-02-06 (×2): 2 g via INTRAVENOUS
  Filled 2016-02-06 (×3): qty 100

## 2016-02-06 MED ORDER — DEXAMETHASONE SODIUM PHOSPHATE 10 MG/ML IJ SOLN
10.0000 mg | Freq: Two times a day (BID) | INTRAMUSCULAR | Status: AC
  Administered 2016-02-06 (×2): 10 mg via INTRAVENOUS
  Filled 2016-02-06 (×2): qty 1

## 2016-02-06 MED ORDER — ACETAMINOPHEN 325 MG PO TABS
650.0000 mg | ORAL_TABLET | Freq: Four times a day (QID) | ORAL | Status: DC | PRN
  Administered 2016-02-07 – 2016-02-08 (×4): 650 mg via ORAL
  Filled 2016-02-06 (×4): qty 2

## 2016-02-06 MED ORDER — HYDROMORPHONE HCL 1 MG/ML IJ SOLN
INTRAMUSCULAR | Status: AC
  Filled 2016-02-06: qty 1

## 2016-02-06 SURGICAL SUPPLY — 63 items
BANDAGE ESMARK 6X9 LF (GAUZE/BANDAGES/DRESSINGS) ×1 IMPLANT
BENZOIN TINCTURE PRP APPL 2/3 (GAUZE/BANDAGES/DRESSINGS) ×2 IMPLANT
BLADE SAGITTAL 25.0X1.19X90 (BLADE) ×2 IMPLANT
BLADE SAW SAG 90X13X1.27 (BLADE) ×2 IMPLANT
BNDG ESMARK 6X9 LF (GAUZE/BANDAGES/DRESSINGS) ×2
BOWL SMART MIX CTS (DISPOSABLE) ×2 IMPLANT
CAP KNEE TOTAL 3 SIGMA ×2 IMPLANT
CEMENT HV SMART SET (Cement) ×4 IMPLANT
COVER SURGICAL LIGHT HANDLE (MISCELLANEOUS) ×2 IMPLANT
CUFF TOURNIQUET SINGLE 34IN LL (TOURNIQUET CUFF) ×2 IMPLANT
CUFF TOURNIQUET SINGLE 44IN (TOURNIQUET CUFF) IMPLANT
DRAPE EXTREMITY T 121X128X90 (DRAPE) ×2 IMPLANT
DRAPE IMP U-DRAPE 54X76 (DRAPES) ×2 IMPLANT
DRAPE U-SHAPE 47X51 STRL (DRAPES) ×2 IMPLANT
DRSG AQUACEL AG ADV 3.5X10 (GAUZE/BANDAGES/DRESSINGS) ×2 IMPLANT
DRSG MEPILEX BORDER 4X12 (GAUZE/BANDAGES/DRESSINGS) IMPLANT
DRSG PAD ABDOMINAL 8X10 ST (GAUZE/BANDAGES/DRESSINGS) IMPLANT
DURAPREP 26ML APPLICATOR (WOUND CARE) ×2 IMPLANT
ELECT REM PT RETURN 9FT ADLT (ELECTROSURGICAL) ×2
ELECTRODE REM PT RTRN 9FT ADLT (ELECTROSURGICAL) ×1 IMPLANT
EVACUATOR 1/8 PVC DRAIN (DRAIN) ×2 IMPLANT
FACESHIELD WRAPAROUND (MASK) ×2 IMPLANT
GAUZE SPONGE 4X4 12PLY STRL (GAUZE/BANDAGES/DRESSINGS) IMPLANT
GLOVE BIOGEL PI IND STRL 8 (GLOVE) ×2 IMPLANT
GLOVE BIOGEL PI INDICATOR 8 (GLOVE) ×2
GLOVE ECLIPSE 7.5 STRL STRAW (GLOVE) ×4 IMPLANT
GOWN STRL REUS W/ TWL LRG LVL3 (GOWN DISPOSABLE) ×1 IMPLANT
GOWN STRL REUS W/ TWL XL LVL3 (GOWN DISPOSABLE) ×2 IMPLANT
GOWN STRL REUS W/TWL LRG LVL3 (GOWN DISPOSABLE) ×1
GOWN STRL REUS W/TWL XL LVL3 (GOWN DISPOSABLE) ×2
HANDPIECE INTERPULSE COAX TIP (DISPOSABLE) ×1
HOOD PEEL AWAY FACE SHEILD DIS (HOOD) ×8 IMPLANT
IMMOBILIZER KNEE 20 (SOFTGOODS) IMPLANT
IMMOBILIZER KNEE 22 UNIV (SOFTGOODS) ×2 IMPLANT
KIT BASIN OR (CUSTOM PROCEDURE TRAY) ×2 IMPLANT
KIT ROOM TURNOVER OR (KITS) ×2 IMPLANT
MANIFOLD NEPTUNE II (INSTRUMENTS) ×2 IMPLANT
NEEDLE SPNL 22GX3.5 QUINCKE BK (NEEDLE) ×2 IMPLANT
NS IRRIG 1000ML POUR BTL (IV SOLUTION) ×2 IMPLANT
PACK TOTAL JOINT (CUSTOM PROCEDURE TRAY) ×2 IMPLANT
PACK UNIVERSAL I (CUSTOM PROCEDURE TRAY) ×2 IMPLANT
PAD ARMBOARD 7.5X6 YLW CONV (MISCELLANEOUS) ×4 IMPLANT
PAD CAST 4YDX4 CTTN HI CHSV (CAST SUPPLIES) ×1 IMPLANT
PADDING CAST COTTON 4X4 STRL (CAST SUPPLIES) ×1
PADDING CAST COTTON 6X4 STRL (CAST SUPPLIES) ×2 IMPLANT
PENCIL BUTTON HOLSTER BLD 10FT (ELECTRODE) ×2 IMPLANT
SET HNDPC FAN SPRY TIP SCT (DISPOSABLE) ×1 IMPLANT
STAPLER VISISTAT 35W (STAPLE) IMPLANT
STRIP CLOSURE SKIN 1/2X4 (GAUZE/BANDAGES/DRESSINGS) ×2 IMPLANT
SUCTION FRAZIER HANDLE 10FR (MISCELLANEOUS) ×1
SUCTION TUBE FRAZIER 10FR DISP (MISCELLANEOUS) ×1 IMPLANT
SUT MNCRL AB 3-0 PS2 18 (SUTURE) ×2 IMPLANT
SUT VIC AB 0 CTB1 27 (SUTURE) ×4 IMPLANT
SUT VIC AB 1 CT1 27 (SUTURE) ×2
SUT VIC AB 1 CT1 27XBRD ANBCTR (SUTURE) ×2 IMPLANT
SUT VIC AB 2-0 CTB1 (SUTURE) ×4 IMPLANT
SYR 50ML LL SCALE MARK (SYRINGE) ×2 IMPLANT
TOWEL OR 17X24 6PK STRL BLUE (TOWEL DISPOSABLE) ×2 IMPLANT
TOWEL OR 17X26 10 PK STRL BLUE (TOWEL DISPOSABLE) ×2 IMPLANT
TRAY CATH 16FR W/PLASTIC CATH (SET/KITS/TRAYS/PACK) ×2 IMPLANT
TRAY FOLEY CATH 16FRSI W/METER (SET/KITS/TRAYS/PACK) IMPLANT
WRAP KNEE MAXI GEL POST OP (GAUZE/BANDAGES/DRESSINGS) ×2 IMPLANT
YANKAUER SUCT BULB TIP NO VENT (SUCTIONS) ×2 IMPLANT

## 2016-02-06 NOTE — Transfer of Care (Signed)
Immediate Anesthesia Transfer of Care Note  Patient: Micheal May  Procedure(s) Performed: Procedure(s): TOTAL KNEE ARTHROPLASTY (Right)  Patient Location: PACU  Anesthesia Type:MAC and Spinal  Level of Consciousness: awake, alert , oriented and patient cooperative  Airway & Oxygen Therapy: Patient Spontanous Breathing and Patient connected to nasal cannula oxygen  Post-op Assessment: Report given to RN and Post -op Vital signs reviewed and stable  Post vital signs: Reviewed  Last Vitals:  Filed Vitals:   02/06/16 0729  BP: 138/94  Pulse: 63  Temp: 36.8 C  Resp: 18    Last Pain:  Filed Vitals:   02/06/16 0730  PainSc: 3       Patients Stated Pain Goal: 3 ([id number])  Complications: No apparent anesthesia complications

## 2016-02-06 NOTE — Brief Op Note (Signed)
02/06/2016  10:08 AM  PATIENT:  Micheal May  60 y.o. male  PRE-OPERATIVE DIAGNOSIS:  Osteoarthritis right knee  POST-OPERATIVE DIAGNOSIS:  Osteoarthritis right knee  PROCEDURE:  Procedure(s): TOTAL KNEE ARTHROPLASTY (Right)  SURGEON:  Surgeon(s) and Role:    * Dorna Leitz, MD - Primary  PHYSICIAN ASSISTANT:   ASSISTANTS: bethune   ANESTHESIA:   spinal  EBL:  Total I/O In: 1000 [I.V.:1000] Out: -   BLOOD ADMINISTERED:none  DRAINS: none   LOCAL MEDICATIONS USED:  MARCAINE    and OTHER experel  SPECIMEN:  No Specimen  DISPOSITION OF SPECIMEN:  N/A  COUNTS:  YES  TOURNIQUET:   Total Tourniquet Time Documented: Thigh (Right) - 58 minutes Total: Thigh (Right) - 58 minutes   DICTATION: .Other Dictation: Dictation Number no number given  PLAN OF CARE: Admit to inpatient   PATIENT DISPOSITION:  PACU - hemodynamically stable.   Delay start of Pharmacological VTE agent (>24hrs) due to surgical blood loss or risk of bleeding: no

## 2016-02-06 NOTE — Discharge Instructions (Signed)

## 2016-02-06 NOTE — Progress Notes (Signed)
Orthopedic Tech Progress Note Patient Details:  Micheal May Apr 23, 1956 [id number] Ortho visit put on footsie roll Patient ID: Micheal May, male   DOB: Jul 21, 1956, 60 y.o.   MRN: [id number]   Micheal May 02/06/2016, 7:11 PM

## 2016-02-06 NOTE — Op Note (Signed)
NAMEJARELL, COLAROSSI NO.:  [id number]  MEDICAL RECORD NO.:  [id number]  LOCATION:  5N30C                        FACILITY:  [facility]  PHYSICIAN:  Alta Corning, M.D.   DATE OF BIRTH:  1956/07/06  DATE OF PROCEDURE:  02/06/2016 DATE OF DISCHARGE:                              OPERATIVE REPORT   PREOPERATIVE DIAGNOSIS:  End-stage degenerative joint disease, right knee.  POSTOPERATIVE DIAGNOSIS:  End-stage degenerative joint disease, right knee.  PROCEDURE:  Right total knee replacement with a Sigma system size 5 femur, size 5 tibia, 10-mm bridging bearing, and a 41-mm all polyethylene patella.  SURGEON:  Alta Corning, M.D.  ASSISTANT:  Gary Fleet, P.A.  ANESTHESIA:  Spinal.  BRIEF HISTORY:  Mr. Gower is a 60 year old male with a long history of significant complaints of right knee pain.  He had been treated conservatively for a prolonged period of time.  After failure of all conservative care including activity modification, injection therapy, physical therapy, and weight loss appropriately, the patient was taken to the operating room for right total knee replacement.  Preoperatively, the patient had night pain and light activity pain and x-ray showed bone- on-bone change.  DESCRIPTION OF PROCEDURE:  The patient was taken to the operating room. After adequate anesthesia was obtained with spinal anesthetic, the patient was placed supine on the operating table.  The right leg was prepped and draped in usual sterile fashion.  Following this, the leg was exsanguinated.  Blood pressure tourniquet inflated to 300 mmHg. Following this, a midline incision was made in subcutaneous tissue and dissected down to the level of the extensor mechanism and a medial parapatellar arthrotomy was undertaken.  Following this, attention was turned to the synovium in the anterior aspect of the femur, which was resected along with the anterior-posterior cruciates, medial  and lateral meniscus, and retropatellar fat pad.  Attention was then turned towards the femur where an intramedullary pilot hole was drilled followed by a 4- degree valgus alignment rod with an 11-degree distal cut and 10 mm of distal bone was resected at this point.  Attention was then turned to the femur which was sized to a 5.  Anterior-posterior cuts were made, chamfers and box.  Attention was then turned towards the tibia, where it was sized to a 5.  It was drilled and keeled, and the 5 tibia was put in place.  Trial components were put in place.  Attention was then turned to the patella where a 41 paddle was chosen.  Lugs were drilled and the trial patella was placed.  Knee was put through a range of motion. Excellent stability and range of motion were achieved.  Then, attention was turned towards removal of trial components.  The posterior capsular release was performed by excision of posterior bone.  Attention was then turned towards the tibia, where the sclerotic bone was drilled.  At this point, the knee was thoroughly and copiously irrigated and suctioned dry.  The final components were then placed, size 5 tibia, size 5 femur, 41-mm all poly patella, this was held with a clamp and a 10-mm bridging bearing trial was placed.  The knee was held in extension  and the cement was allowed to completely harden.  Once the cement was completely hardened, attention was turned towards removal of the trial component and the excess bone cement had all been removed.  The  tourniquet was let down.  All bleeding was controlled with electrocautery.  A 60 mL of 20 mL Exparel, 20 mL saline and 20 mL of 0.25% Marcaine were now instilled with multiple sticks throughout the synovial reflection throughout the subcutaneous fat for postoperative pain control.  Once this was done, the tourniquet had been let down, all bleeding controlled with electrocautery.  The final poly was placed and the knee put  through a range of motion where excellent stability was achieved.  The medial parapatellar approach was closed with 1 Vicryl running suture, the skin with 2-0 Vicryl and 3-0 Monocryl subcuticular.  Benzoin and Steri-Strips were applied, sterile compressive dressing was applied.  The patient was taken to the recovery room where he was noted to be in satisfactory condition.  Estimated blood loss for the procedure was minimal.     Alta Corning, M.D.     Corliss Skains  D:  02/06/2016  T:  02/06/2016  Job:  [id number]

## 2016-02-06 NOTE — Progress Notes (Signed)
Orthopedic Tech Progress Note Patient Details:  Micheal May 04/02/1956 [id number]  CPM Right Knee CPM Right Knee: On Right Knee Flexion (Degrees): 90 Right Knee Extension (Degrees): 0 Additional Comments: trapeze bar patient helper Viewed order from doctor's order list  Hildred Priest 02/06/2016, 11:07 AM

## 2016-02-06 NOTE — Evaluation (Signed)
Physical Therapy Evaluation Patient Details Name: Micheal May MRN: [id number] DOB: [birth year] Today's Date: 02/06/2016   History of Present Illness  Patient is a 60 y/o male with hx of anxiety, Grover's disease, MRSA presents s/p Rt TKA.   Clinical Impression  Patient presents with pain, nausea and post surgical deficits RLE s/p Rt TKA. Mobility limited due to nausea and diaphoresis. Tolerated bed mobility and sitting EOB with Min A. Transfers deferred due to above. Instructed pt in exercises. Encouraged OOB later when feeling better. Will follow acutely to maximize independence and mobility prior to return home. Will have support of g/f for the week.    Follow Up Recommendations Home health PT;Supervision - Intermittent    Equipment Recommendations  None recommended by PT    Recommendations for Other Services OT consult     Precautions / Restrictions Precautions Precautions: Knee Precaution Comments: Reveiwed no pillow under knee and precautions. Required Braces or Orthoses: Knee Immobilizer - Right Restrictions Weight Bearing Restrictions: Yes RLE Weight Bearing: Weight bearing as tolerated      Mobility  Bed Mobility Overal bed mobility: Needs Assistance Bed Mobility: Supine to Sit;Sit to Supine     Supine to sit: Min assist Sit to supine: Min assist   General bed mobility comments: Assist to move RLE into and out of bed. Increased time., + nausea. BP 132/81.  Transfers                 General transfer comment: Deferred secondary to nausea and feeling like he was going to be sick  Ambulation/Gait                Stairs            Wheelchair Mobility    Modified Rankin (Stroke Patients Only)       Balance Overall balance assessment: Needs assistance Sitting-balance support: Feet supported Sitting balance-Leahy Scale: Good                                       Pertinent Vitals/Pain Pain Assessment: 0-10 Pain Score:  9  Pain Location: right knee, quad Pain Descriptors / Indicators: Sore;Aching Pain Intervention(s): Monitored during session;RN gave pain meds during session;Repositioned;Limited activity within patient's tolerance    Home Living Family/patient expects to be discharged to:: Private residence Living Arrangements: Spouse/significant other Available Help at Discharge: Family;Available 24 hours/day (g/f will be home for a week to help) Type of Home: House Home Access: Stairs to enter Entrance Stairs-Rails: None Entrance Stairs-Number of Steps: 1 threshold step Home Layout: One level Home Equipment: Walker - 2 wheels;Bedside commode;Shower seat;Cane - single point      Prior Function Level of Independence: Independent         Comments: Works, drives. Walks 5-6 miles per day at job.     Hand Dominance        Extremity/Trunk Assessment   Upper Extremity Assessment: Defer to OT evaluation           Lower Extremity Assessment: RLE deficits/detail RLE Deficits / Details: Limited AROM/strength secondary to pain.       Communication   Communication: No difficulties  Cognition Arousal/Alertness: Awake/alert Behavior During Therapy: WFL for tasks assessed/performed Overall Cognitive Status: Within Functional Limits for tasks assessed                      General Comments  Exercises Total Joint Exercises Ankle Circles/Pumps: Both;10 reps;Supine Quad Sets: Both;10 reps;Supine Gluteal Sets: Both;10 reps;Supine      Assessment/Plan    PT Assessment Patient needs continued PT services  PT Diagnosis Generalized weakness;Acute pain   PT Problem List Decreased strength;Pain;Decreased range of motion;Impaired sensation;Decreased activity tolerance;Decreased balance;Decreased mobility;Decreased knowledge of use of DME  PT Treatment Interventions Balance training;Gait training;Functional mobility training;Therapeutic activities;Therapeutic  exercise;Patient/family education;Stair training;DME instruction   PT Goals (Current goals can be found in the Care Plan section) Acute Rehab PT Goals Patient Stated Goal: to return to life PT Goal Formulation: With patient Time For Goal Achievement: 02/20/16 Potential to Achieve Goals: Fair    Frequency 7X/week   Barriers to discharge        Co-evaluation               End of Session Equipment Utilized During Treatment: Gait belt;Right knee immobilizer Activity Tolerance: Treatment limited secondary to medical complications (Comment) (nausea) Patient left: in bed;with call bell/phone within reach Nurse Communication: Mobility status         Time: 1500-1536 PT Time Calculation (min) (ACUTE ONLY): 36 min   Charges:   PT Evaluation $PT Eval Moderate Complexity: 1 Procedure PT Treatments $Therapeutic Activity: 8-22 mins   PT G Codes:        Micheal May 02/06/2016, 3:39 PM Micheal May, PT, DPT [phone]

## 2016-02-06 NOTE — Anesthesia Postprocedure Evaluation (Signed)
Anesthesia Post Note  Patient: Micheal May  Procedure(s) Performed: Procedure(s) (LRB): TOTAL KNEE ARTHROPLASTY (Right)  Patient location during evaluation: PACU Anesthesia Type: Spinal Level of consciousness: awake, awake and alert, oriented and patient cooperative Pain management: pain level controlled Vital Signs Assessment: post-procedure vital signs reviewed and stable Respiratory status: spontaneous breathing and respiratory function stable Cardiovascular status: blood pressure returned to baseline and stable Postop Assessment: spinal receding Anesthetic complications: no    Last Vitals:  Filed Vitals:   02/06/16 1115 02/06/16 1126  BP:  112/82  Pulse: 54 57  Temp:    Resp: 20 16    Last Pain:  Filed Vitals:   02/06/16 1131  PainSc: 0-No pain                 Drago Hammonds EDWARD

## 2016-02-06 NOTE — Anesthesia Preprocedure Evaluation (Signed)
Anesthesia Evaluation  Patient identified by MRN, date of birth, ID band Patient awake    Reviewed: Allergy & Precautions, NPO status , reviewed documented beta blocker date and time   History of Anesthesia Complications (+) PONV  Airway Mallampati: I       Dental   Pulmonary    Pulmonary exam normal        Cardiovascular Normal cardiovascular exam     Neuro/Psych    GI/Hepatic GERD  ,  Endo/Other    Renal/GU      Musculoskeletal   Abdominal   Peds  Hematology   Anesthesia Other Findings   Reproductive/Obstetrics                             Anesthesia Physical Anesthesia Plan  ASA: I  Anesthesia Plan:    Post-op Pain Management:    Induction:   Airway Management Planned:   Additional Equipment:   Intra-op Plan:   Post-operative Plan:   Informed Consent: I have reviewed the patients History and Physical, chart, labs and discussed the procedure including the risks, benefits and alternatives for the proposed anesthesia with the patient or authorized representative who has indicated his/her understanding and acceptance.     Plan Discussed with: CRNA, Anesthesiologist and Surgeon  Anesthesia Plan Comments:         Anesthesia Quick Evaluation

## 2016-02-06 NOTE — Anesthesia Procedure Notes (Signed)
Spinal Patient location during procedure: OR Start time: 02/06/2016 8:35 AM End time: 02/06/2016 8:38 AM Staffing Anesthesiologist: Kate Sable Performed by: anesthesiologist  Preanesthetic Checklist Completed: patient identified, site marked, surgical consent, pre-op evaluation, timeout performed, IV checked, risks and benefits discussed and monitors and equipment checked Spinal Block Patient position: right lateral decubitus Prep: ChloraPrep Patient monitoring: cardiac monitor, continuous pulse ox, blood pressure and heart rate Approach: midline Location: L3-4 Injection technique: single-shot Needle Needle type: Quincke  Needle gauge: 22 G Needle length: 9 cm Needle insertion depth: 4 cm Assessment Sensory level: T10 Additional Notes Pt accepts procedure w/ risks. 10mg  0.75% Marcaine w/ epi w/o difficulty. Pt tolerated well. GES

## 2016-02-07 ENCOUNTER — Inpatient Hospital Stay (HOSPITAL_COMMUNITY): Payer: BLUE CROSS/BLUE SHIELD

## 2016-02-07 ENCOUNTER — Encounter (HOSPITAL_COMMUNITY): Payer: Self-pay | Admitting: Radiology

## 2016-02-07 LAB — CBC
HEMATOCRIT: 37.9 % — AB (ref 39.0–52.0)
HEMOGLOBIN: 13.3 g/dL (ref 13.0–17.0)
MCH: 32.4 pg (ref 26.0–34.0)
MCHC: 35.1 g/dL (ref 30.0–36.0)
MCV: 92.4 fL (ref 78.0–100.0)
Platelets: 167 10*3/uL (ref 150–400)
RBC: 4.1 MIL/uL — AB (ref 4.22–5.81)
RDW: 12.8 % (ref 11.5–15.5)
WBC: 12 10*3/uL — ABNORMAL HIGH (ref 4.0–10.5)

## 2016-02-07 LAB — BASIC METABOLIC PANEL
Anion gap: 7 (ref 5–15)
BUN: 10 mg/dL (ref 6–20)
CHLORIDE: 102 mmol/L (ref 101–111)
CO2: 29 mmol/L (ref 22–32)
CREATININE: 0.88 mg/dL (ref 0.61–1.24)
Calcium: 8.9 mg/dL (ref 8.9–10.3)
GFR calc Af Amer: >60 mL/min (ref >60)
GFR calc non Af Amer: >60 mL/min (ref >60)
GLUCOSE: 164 mg/dL — AB (ref 65–99)
POTASSIUM: 4.1 mmol/L (ref 3.5–5.1)
Sodium: 138 mmol/L (ref 135–145)

## 2016-02-07 MED ORDER — IOPAMIDOL (ISOVUE-300) INJECTION 61%
INTRAVENOUS | Status: AC
  Administered 2016-02-07: 100 mL
  Filled 2016-02-07: qty 100

## 2016-02-07 NOTE — Progress Notes (Signed)
Subjective: 1 Day Post-Op Procedure(s) (LRB): TOTAL KNEE ARTHROPLASTY (Right) Patient reports pain as 6 on 0-10 scale.  Taking by mouth and voiding okay.  Awaiting CT scan of chest to evaluate preop pulmonary nodule on his chest x-ray.  Objective: Vital signs in last 24 hours: Temp:  [97.7 F (36.5 C)-99 F (37.2 C)] 98.4 F (36.9 C) (06/29 0944) Pulse Rate:  [64-93] 78 (06/29 0944) Resp:  [13-18] 18 (06/29 0944) BP: (108-126)/(70-89) 108/70 mmHg (06/29 0944) SpO2:  [97 %-100 %] 97 % (06/29 0944)  Intake/Output from previous day: 06/28 0701 - 06/29 0700 In: 2240 [P.O.:480; I.V.:1650; IV Piggyback:110] Out: 2130 [Urine:2100; Blood:30] Intake/Output this shift:     Recent Labs  02/07/16 0520  HGB 13.3    Recent Labs  02/07/16 0520  WBC 12.0*  RBC 4.10*  HCT 37.9*  PLT 167    Recent Labs  02/07/16 0520  NA 138  K 4.1  CL 102  CO2 29  BUN 10  CREATININE 0.88  GLUCOSE 164*  CALCIUM 8.9   No results for input(s): LABPT, INR in the last 72 hours. Right knee exam: Mild effusion.  Range of motion -5 to 55 of flexion.  Calf soft.  Dressing clean and dry. Neurovascular intact Sensation intact distally Intact pulses distally Dorsiflexion/Plantar flexion intact Compartment soft  Assessment/Plan: 1 Day Post-Op Procedure(s) (LRB): TOTAL KNEE ARTHROPLASTY (Right)  Preop pulmonary nodule on chest x-ray awaiting CT scan workup as recommended per radiology. Plan: Aspirin 325 mg twice daily with SCDs for DVT prophylaxis. Up with therapy Discharge home with home health when he passes physical therapy and his CT scan has been completed.  Possibly later today.  Jemell Town G 02/07/2016, 12:45 PM

## 2016-02-07 NOTE — Care Management Note (Signed)
Case Management Note  Patient Details  Name: Micheal May MRN: [id number] Date of Birth: 05-Apr-1956  Subjective/Objective:  60 yr old gentleman, s/pright total knee arthroplasty.                  Action/Plan: Case manager spoke with patient concerning home health and DME needs. Patient states he has rolling walker and 3in1. CPM will be delivered to his home. Choice was offered for home health agency, patient was preoperatively setup with [facility], no changes. He will have family support at discharge.    Expected Discharge Date:   02/08/16               Expected Discharge Plan:  [place]  In-House Referral:     Discharge planning Services  CM Consult  Post Acute Care Choice:  Durable Medical Equipment, Home Health Choice offered to:  Patient  DME Arranged:  CPM DME Agency:  [employer]  HH Arranged:  PT HH Agency:  [facility]  Status of Service:  Completed, signed off  If discussed at [place] of Stay Meetings, dates discussed:    Additional Comments:  Ninfa Meeker, RN 02/07/2016, 2:27 PM

## 2016-02-07 NOTE — Progress Notes (Signed)
Physical Therapy Treatment Patient Details Name: Micheal May MRN: [id number] DOB: 28-Feb-1956 Today's Date: 02/07/2016    History of Present Illness Patient is a 60 y/o male with hx of anxiety, Grover's disease, MRSA presents s/p Rt TKA.     PT Comments    Patient is progressing well toward PT goals. Tolerated gait/stair training well and with less c/o pain this session. G/f present and actively participating. Given HEP handout. Current plan remains appropriate.   Follow Up Recommendations  Home health PT;Supervision - Intermittent     Equipment Recommendations  None recommended by PT    Recommendations for Other Services OT consult     Precautions / Restrictions Precautions Precautions: Knee Precaution Comments: Reveiwed no pillow under knee and precautions. Required Braces or Orthoses: Knee Immobilizer - Right Restrictions Weight Bearing Restrictions: Yes RLE Weight Bearing: Weight bearing as tolerated    Mobility  Bed Mobility Overal bed mobility: Needs Assistance Bed Mobility: Supine to Sit     Supine to sit: Min assist     General bed mobility comments: OOB in chair upon arrival  Transfers Overall transfer level: Needs assistance Equipment used: Rolling walker (2 wheeled) Transfers: Sit to/from Stand Sit to Stand: Min guard         General transfer comment: carry over of safe hand placement and technique; min guard for safety; no physical assist  Ambulation/Gait Ambulation/Gait assistance: Supervision Ambulation Distance (Feet): 300 Feet Assistive device: Rolling walker (2 wheeled) Gait Pattern/deviations: Step-through pattern;Decreased stride length;Decreased weight shift to right;Trunk flexed     General Gait Details: cues for posture and symmetrical step length; pt with increased WB on R LE this session with less c/o pain   Stairs Stairs: Yes Stairs assistance: Min guard Stair Management: No rails;Backwards;With walker Number of Stairs:  1 General stair comments: educated on sequencing and technique; good safety awareness; g/f present and actively participating  Wheelchair Mobility    Modified Rankin (Stroke Patients Only)       Balance Overall balance assessment: Needs assistance Sitting-balance support: Feet supported;No upper extremity supported Sitting balance-Leahy Scale: Good     Standing balance support: Bilateral upper extremity supported Standing balance-Leahy Scale: Fair                      Cognition Arousal/Alertness: Awake/alert Behavior During Therapy: WFL for tasks assessed/performed Overall Cognitive Status: Within Functional Limits for tasks assessed                      Exercises Total Joint Exercises Quad Sets: Both;10 reps;Supine Heel Slides: Strengthening;Right;10 reps;Supine Hip ABduction/ADduction: Strengthening;Right;10 reps;Supine Goniometric ROM: 0-45    General Comments General comments (skin integrity, edema, etc.): given HEP handout and reviewed      Pertinent Vitals/Pain Pain Assessment: Faces Pain Score: 7  Faces Pain Scale: Hurts little more Pain Location: R thigh Pain Descriptors / Indicators: Aching;Guarding;Sore Pain Intervention(s): Limited activity within patient's tolerance;Monitored during session;Repositioned;Ice applied    Home Living                      Prior Function            PT Goals (current goals can now be found in the care plan section) Acute Rehab PT Goals Patient Stated Goal: be independent Progress towards PT goals: Progressing toward goals    Frequency  7X/week    PT Plan Current plan remains appropriate    Co-evaluation  End of Session Equipment Utilized During Treatment: Gait belt;Right knee immobilizer Activity Tolerance: Patient tolerated treatment well Patient left: in chair;with call bell/phone within reach;with family/visitor present     Time: [id number] PT Time Calculation  (min) (ACUTE ONLY): 30 min  Charges:  $Gait Training: 23-37 mins $Therapeutic Exercise: 8-22 mins $Therapeutic Activity: 8-22 mins                    G Codes:      Salina April, PTA Pager: [phone]   02/07/2016, 3:30 PM

## 2016-02-07 NOTE — Progress Notes (Signed)
Orthopedic Tech Progress Note Patient Details:  Micheal May [birth year] [id number]  Patient ID: Micheal May, male   DOB: [birth year], 60 y.o.   MRN: [id number] Applied cpm 0-40. Pt couldn't handle it any higher.  [name] 02/07/2016, 6:32 AM

## 2016-02-07 NOTE — Progress Notes (Signed)
Physical Therapy Treatment Patient Details Name: Micheal May MRN: [id number] DOB: 27-May-1956 Today's Date: 02/07/2016    History of Present Illness Patient is a 60 y/o male with hx of anxiety, Grover's disease, MRSA presents s/p Rt TKA.     PT Comments    Patient is progressing toward mobility goals. Tolerated gait training and therex well with no c/o nausea. Continue to progress as tolerated with anticipated d/c home with HHPT.   Follow Up Recommendations  Home health PT;Supervision - Intermittent     Equipment Recommendations  None recommended by PT    Recommendations for Other Services OT consult     Precautions / Restrictions Precautions Precautions: Knee Precaution Comments: Reveiwed no pillow under knee and precautions. Required Braces or Orthoses: Knee Immobilizer - Right Restrictions Weight Bearing Restrictions: Yes RLE Weight Bearing: Weight bearing as tolerated    Mobility  Bed Mobility Overal bed mobility: Needs Assistance Bed Mobility: Supine to Sit     Supine to sit: Min assist     General bed mobility comments: cues for technique; HOB flat and no use of rail; assist to lower R LE from bed  Transfers Overall transfer level: Needs assistance Equipment used: Rolling walker (2 wheeled) Transfers: Sit to/from Stand Sit to Stand: Min guard         General transfer comment: min guard for safety; cues for hand placement and technique  Ambulation/Gait Ambulation/Gait assistance: Min guard Ambulation Distance (Feet): 140 Feet Assistive device: Rolling walker (2 wheeled) Gait Pattern/deviations: Step-to pattern;Step-through pattern;Decreased stride length;Decreased weight shift to right;Decreased stance time - right;Trunk flexed     General Gait Details: cues for posture, sequencing, and R quad activation during stance phase; c/o sharp pains X2 during ambulation but no knee buckling or unsteadiness; step to pattern initially and demo'd improved step  length symmetry with increased distance   Stairs            Wheelchair Mobility    Modified Rankin (Stroke Patients Only)       Balance Overall balance assessment: Needs assistance Sitting-balance support: Feet supported;No upper extremity supported Sitting balance-Leahy Scale: Good     Standing balance support: Bilateral upper extremity supported Standing balance-Leahy Scale: Fair                      Cognition Arousal/Alertness: Awake/alert Behavior During Therapy: WFL for tasks assessed/performed Overall Cognitive Status: Within Functional Limits for tasks assessed                      Exercises Total Joint Exercises Quad Sets: Both;10 reps;Supine Heel Slides: Strengthening;Right;10 reps;Supine Hip ABduction/ADduction: Strengthening;Right;10 reps;Supine Goniometric ROM: 0-45    General Comments General comments (skin integrity, edema, etc.): reviewed positioning and use of ice      Pertinent Vitals/Pain Pain Assessment: 0-10 Pain Score: 7  Pain Location: R knee Pain Descriptors / Indicators: Aching;Guarding;Sore Pain Intervention(s): Limited activity within patient's tolerance;Monitored during session;Premedicated before session;Repositioned;Ice applied    Home Living                      Prior Function            PT Goals (current goals can now be found in the care plan section) Acute Rehab PT Goals Patient Stated Goal: be independent Progress towards PT goals: Progressing toward goals    Frequency  7X/week    PT Plan Current plan remains appropriate    Co-evaluation  End of Session Equipment Utilized During Treatment: Gait belt;Right knee immobilizer Activity Tolerance: Patient tolerated treatment well Patient left: in chair;with call bell/phone within reach     Time: 1050-1129 PT Time Calculation (min) (ACUTE ONLY): 39 min  Charges:  $Gait Training: 8-22 mins $Therapeutic Exercise: 8-22  mins $Therapeutic Activity: 8-22 mins                    G Codes:      Salina April, PTA Pager: [phone]   02/07/2016, 11:40 AM

## 2016-02-08 LAB — TYPE AND SCREEN
ABO/RH(D): A NEG
ANTIBODY SCREEN: NEGATIVE
UNIT DIVISION: 0
Unit division: 0

## 2016-02-08 LAB — CBC
HCT: 35.1 % — ABNORMAL LOW (ref 39.0–52.0)
Hemoglobin: 12.1 g/dL — ABNORMAL LOW (ref 13.0–17.0)
MCH: 32.4 pg (ref 26.0–34.0)
MCHC: 34.5 g/dL (ref 30.0–36.0)
MCV: 93.9 fL (ref 78.0–100.0)
PLATELETS: 144 10*3/uL — AB (ref 150–400)
RBC: 3.74 MIL/uL — ABNORMAL LOW (ref 4.22–5.81)
RDW: 13.2 % (ref 11.5–15.5)
WBC: 10.6 10*3/uL — AB (ref 4.0–10.5)

## 2016-02-08 NOTE — Progress Notes (Signed)
Physical Therapy Treatment Patient Details Name: Micheal May MRN: [id number] DOB: 1956/01/10 Today's Date: 02/08/2016    History of Present Illness Patient is a 60 y/o male with hx of anxiety, Grover's disease, MRSA presents s/p Rt TKA.     PT Comments    Continuing to progress well with therapy. Practiced/reviewed exercises, ambulation, and step training. Wife present during session as well. Verbally reviewed car transfer. All education completed. Ready to d/c from PT standpoint.   Follow Up Recommendations  Home health PT;Supervision - Intermittent     Equipment Recommendations  None recommended by PT    Recommendations for Other Services       Precautions / Restrictions Precautions Precautions: Knee Precaution Comments: Reveiwed no pillow under knee and precautions. Required Braces or Orthoses: Knee Immobilizer - Right Restrictions Weight Bearing Restrictions: No RLE Weight Bearing: Weight bearing as tolerated    Mobility  Bed Mobility Overal bed mobility: Needs Assistance Bed Mobility: Supine to Sit;Sit to Supine     Supine to sit: Min assist Sit to supine: Min assist   General bed mobility comments: Assist for R LE.   Transfers Overall transfer level: Needs assistance Equipment used: Rolling walker (2 wheeled) Transfers: Sit to/from Stand Sit to Stand: Supervision         General transfer comment: for safety. VCs hand placement  Ambulation/Gait Ambulation/Gait assistance: Supervision Ambulation Distance (Feet): 125 Feet Assistive device: Rolling walker (2 wheeled) Gait Pattern/deviations: Step-to pattern;Antalgic     General Gait Details: for safety. slow gait speed. No LOB   Stairs Stairs: Yes Stairs assistance: Min guard Stair Management: With walker;Backwards;Step to pattern Number of Stairs: 1 General stair comments: close guard for safety. wife present. VCs safety, technique, sequence.   Wheelchair Mobility    Modified Rankin  (Stroke Patients Only)       Balance                                    Cognition Arousal/Alertness: Awake/alert Behavior During Therapy: WFL for tasks assessed/performed Overall Cognitive Status: Within Functional Limits for tasks assessed                      Exercises Total Joint Exercises Ankle Circles/Pumps: AROM;Both;15 reps;Supine Quad Sets: AROM;Both;10 reps;Supine Heel Slides: AAROM;Right;10 reps;Supine Straight Leg Raises: AAROM;Right;10 reps;Supine Goniometric ROM: ~5-50 degrees    General Comments        Pertinent Vitals/Pain Pain Assessment: 0-10 Pain Score: 6  Pain Location: R thigh Pain Descriptors / Indicators: Tender;Sore;Aching Pain Intervention(s): Monitored during session;Ice applied;Repositioned    Home Living                      Prior Function            PT Goals (current goals can now be found in the care plan section) Progress towards PT goals: Progressing toward goals    Frequency  7X/week    PT Plan Current plan remains appropriate    Co-evaluation             End of Session Equipment Utilized During Treatment: Right knee immobilizer Activity Tolerance: Patient tolerated treatment well Patient left: with call bell/phone within reach;with family/visitor present;in bed     Time: 1050-1116 PT Time Calculation (min) (ACUTE ONLY): 26 min  Charges:  $Gait Training: 8-22 mins $Therapeutic Exercise: 8-22 mins  G Codes:      Weston Anna, MPT Pager: [phone]

## 2016-02-08 NOTE — Consult Note (Signed)
Reason for Consult:Lung nodule Referring Physician: Dr. Dorna Leitz  Micheal May is an 60 y.o. male.  HPI: 60 yo man who had a right knee replacement earlier this week. Preop CXR showed a left lung nodule, confirmed by CT 1.2 cm well circumscribed in left upper lobe.  He is a lifelong nonsmoker. No exposure to asbestos, birds or travel. No fevers, chills, malaise or weight loss.   Zubrod Score: At the time of surgery this patient's most appropriate activity status/level should be described as: [x]     0    Normal activity, no symptoms []     1    Restricted in physical strenuous activity but ambulatory, able to do out light work []     2    Ambulatory and capable of self care, unable to do work activities, up and about >50 % of waking hours                              []     3    Only limited self care, in bed greater than 50% of waking hours []     4    Completely disabled, no self care, confined to bed or chair []     5    Moribund   Past Medical History  Diagnosis Date  . GERD (gastroesophageal reflux disease)   . PONV (postoperative nausea and vomiting)   . Anxiety   . Grover's disease   . MRSA (methicillin resistant staph aureus) culture positive     hx of due to ingrown hair    10  yrs ago  . Arthritis     OA    Past Surgical History  Procedure Laterality Date  . Appendectomy    . Knee surgery Bilateral   . Total knee arthroplasty Right 02/06/2016  . Total knee arthroplasty Right 02/06/2016    Procedure: TOTAL KNEE ARTHROPLASTY;  Surgeon: Dorna Leitz, MD;  Location: [facility];  Service: Orthopedics;  Laterality: Right;    Family History  Problem Relation Age of Onset  . Hypertension Mother     Social History:  reports that he has never smoked. He has never used smokeless tobacco. He reports that he drinks alcohol. He reports that he does not use illicit drugs.  Allergies: No Known Allergies  Medications:  Prior to Admission:  Prescriptions prior to admission   Medication Sig Dispense Refill Last Dose  . ascorbic acid (VITAMIN C) 1000 MG tablet Take 2,000 mg by mouth daily.   Past Week at Unknown time  . escitalopram (LEXAPRO) 10 MG tablet Take 10 mg by mouth daily.   02/05/2016 at 2200  . omeprazole (PRILOSEC) 20 MG capsule Take 20 mg by mouth daily.   02/06/2016 at 0530  . triamcinolone cream (KENALOG) 0.1 % Apply 1 application topically 2 (two) times daily as needed (Grover's Disease).   Past Week at Unknown time  . valACYclovir (VALTREX) 500 MG tablet Take 500 mg by mouth daily.    02/06/2016 at 0530  . zolpidem (AMBIEN) 10 MG tablet Take 10 mg by mouth at bedtime as needed for sleep.   2 Past Week at Unknown time    Results for orders placed or performed during the hospital encounter of 02/06/16 (from the past 48 hour(s))  CBC     Status: Abnormal   Collection Time: 02/07/16  5:20 AM  Result Value Ref Range   WBC 12.0 (H) 4.0 - 10.5 K/uL  RBC 4.10 (L) 4.22 - 5.81 MIL/uL   Hemoglobin 13.3 13.0 - 17.0 g/dL   HCT 37.9 (L) 39.0 - 52.0 %   MCV 92.4 78.0 - 100.0 fL   MCH 32.4 26.0 - 34.0 pg   MCHC 35.1 30.0 - 36.0 g/dL   RDW 12.8 11.5 - 15.5 %   Platelets 167 150 - 400 K/uL  Basic metabolic panel     Status: Abnormal   Collection Time: 02/07/16  5:20 AM  Result Value Ref Range   Sodium 138 135 - 145 mmol/L   Potassium 4.1 3.5 - 5.1 mmol/L   Chloride 102 101 - 111 mmol/L   CO2 29 22 - 32 mmol/L   Glucose, Bld 164 (H) 65 - 99 mg/dL   BUN 10 6 - 20 mg/dL   Creatinine, Ser 0.88 0.61 - 1.24 mg/dL   Calcium 8.9 8.9 - 10.3 mg/dL   GFR calc non Af Amer >60 >60 mL/min   GFR calc Af Amer >60 >60 mL/min    Comment: (NOTE) The eGFR has been calculated using the CKD EPI equation. This calculation has not been validated in all clinical situations. eGFR's persistently <60 mL/min signify possible Chronic Kidney Disease.    Anion gap 7 5 - 15  CBC     Status: Abnormal   Collection Time: 02/08/16  7:28 AM  Result Value Ref Range   WBC 10.6 (H) 4.0  - 10.5 K/uL   RBC 3.74 (L) 4.22 - 5.81 MIL/uL   Hemoglobin 12.1 (L) 13.0 - 17.0 g/dL   HCT 35.1 (L) 39.0 - 52.0 %   MCV 93.9 78.0 - 100.0 fL   MCH 32.4 26.0 - 34.0 pg   MCHC 34.5 30.0 - 36.0 g/dL   RDW 13.2 11.5 - 15.5 %   Platelets 144 (L) 150 - 400 K/uL    Ct Chest W Contrast  02/07/2016  CLINICAL DATA:  Abnormal finding on preop chest x-ray. EXAM: CT CHEST WITH CONTRAST TECHNIQUE: Multidetector CT imaging of the chest was performed during intravenous contrast administration. CONTRAST:  147m ISOVUE-300 IOPAMIDOL (ISOVUE-300) INJECTION 61% COMPARISON:  None. FINDINGS: Cardiovascular: Normal heart size. Normal thoracic aorta. No mediastinal or hilar lymphadenopathy. Calcified mediastinal and right hilar lymph nodes as can be seen with prior granulomatous disease. Mediastinum/Nodes: No lymphadenopathy.  Normal esophagus. Lungs/Pleura: No focal consolidation, pleural effusion or pneumothorax. 12 mm well-circumscribed pulmonary nodule in the anterior left upper lobe (image 64/ series 3). Upper Abdomen: No focal abnormality. Musculoskeletal: No acute osseous abnormality. No lytic or sclerotic osseous lesion. IMPRESSION: 1. 12 mm well-circumscribed pulmonary nodule in the anterior left upper lobe (image 64/ series 3). Recommend follow-up CT in 3 months or PET/CT for further evaluation. This recommendation follows the consensus statement: "Guidelines for Management of Small Pulmonary Nodules Detected on CT Scans: A Statement from the [employer] as published in Radiology 2005; 237:395-400. Electronically Signed   By: HKathreen Devoid  On: 02/07/2016 15:03    Review of Systems  Constitutional: Negative for fever and chills.  Respiratory: Negative for cough, shortness of breath and wheezing.   Cardiovascular: Negative for chest pain.  Musculoskeletal: Positive for joint pain.  All other systems reviewed and are negative.  Blood pressure 121/78, pulse 85, temperature 98 F (36.7 C), temperature  source Oral, resp. rate 18, height 6' (1.829 m), weight 195 lb 12.8 oz (88.814 kg), SpO2 96 %. Physical Exam  Vitals reviewed. Constitutional: He is oriented to person, place, and time. He appears well-developed and  well-nourished. No distress.  HENT:  Head: Normocephalic and atraumatic.  Eyes: Conjunctivae and EOM are normal. No scleral icterus.  Neck: Neck supple.  Cardiovascular: Normal rate, regular rhythm, normal heart sounds and intact distal pulses.   No murmur heard. Respiratory: Effort normal and breath sounds normal. No respiratory distress. He has no wheezes. He has no rales.  GI: Soft. He exhibits no distension. There is no tenderness.  Musculoskeletal:  Right knee wrapped & on PROM machine  Neurological: He is alert and oriented to person, place, and time. No cranial nerve deficit. He exhibits normal muscle tone.  Skin: Skin is warm and dry.  Psychiatric: He has a normal mood and affect.    Assessment/Plan: 60 yo man with an incidentally noted LUL lung nodule. This is 1.2 cm and is well circumscribed.  This is most likely a benign nodule. Although malignancy is unlikely, it can not be ruled out based on this 1 scan.  I discussed the differential diagnosis with him- hamartoma, carcinoid, granuloma, carcinoma.   We discussed options for management including radiographic observation, PET/CT, surgical resection.   I will see him back in the office in about a month for further discussion  Melrose Nakayama 02/08/2016, 8:44 AM

## 2016-02-08 NOTE — Discharge Summary (Signed)
Patient ID: Micheal May MRN: [id number] DOB/AGE: [birth year] 60 y.o.  Admit date: 02/06/2016 Discharge date: 02/08/2016  Admission Diagnoses:  Principal Problem:   Primary osteoarthritis of right knee Active Problems:   Solitary pulmonary nodule Pulmonary nodule measured 12 mm in left upper lobe  Discharge Diagnoses:  Same  Past Medical History  Diagnosis Date  . GERD (gastroesophageal reflux disease)   . PONV (postoperative nausea and vomiting)   . Anxiety   . Grover's disease   . MRSA (methicillin resistant staph aureus) culture positive     hx of due to ingrown hair    10  yrs ago  . Arthritis     OA    Surgeries: Procedure(s):Right TOTAL KNEE ARTHROPLASTY on 02/06/2016   Consultants:  Merilynn Finland M.D.   CT surgery. Discharged Condition: Improved  Hospital Course: Micheal May is an 60 y.o. male who was admitted 02/06/2016 for operative treatment ofPrimary osteoarthritis of right knee. Patient has severe unremitting pain that affects sleep, daily activities, and work/hobbies. After pre-op clearance the patient was taken to the operating room on 02/06/2016 and underwent  Procedure(s): Right TOTAL KNEE ARTHROPLASTY.    Patient was given perioperative antibiotics: Anti-infectives    Start     Dose/Rate Route Frequency Ordered Stop   02/07/16 1000  valACYclovir (VALTREX) tablet 500 mg     500 mg Oral Daily 02/06/16 1317     02/06/16 1500  ceFAZolin (ANCEF) IVPB 2g/100 mL premix     2 g 200 mL/hr over 30 Minutes Intravenous Every 6 hours 02/06/16 1317 02/06/16 2232   02/06/16 1330  vancomycin (VANCOCIN) IVPB 1000 mg/200 mL premix  Status:  Discontinued     1,000 mg 200 mL/hr over 60 Minutes Intravenous Every 12 hours 02/06/16 1317 02/06/16 1337   02/05/16 0930  vancomycin (VANCOCIN) IVPB 1000 mg/200 mL premix     1,000 mg 200 mL/hr over 60 Minutes Intravenous  Once 02/05/16 0927 02/06/16 1000   02/05/16 0927  ceFAZolin (ANCEF) IVPB 2g/100 mL premix  Status:   Discontinued     2 g 200 mL/hr over 30 Minutes Intravenous On call to O.R. 02/05/16 [id number] 02/06/16 1317       Patient was given sequential compression devices, early ambulation, and chemoprophylaxis to prevent DVT. Patient had a pulmonary nodule noted on his preoperative chest x-ray. Radiology recommended a contrasted CT scan of the chest. This confirmed a 12 mm nodule in the left upper lobe. This was evaluated by Dr. Roxan Hockey. See Dr. Leonarda Salon consult note for details. He will follow-up with the patient in one month. The patient did well from a knee viewpoint and made good progress with physical therapy.  Patient benefited maximally from hospital stay and there were no complications.    Recent vital signs: Patient Vitals for the past 24 hrs:  BP Temp Temp src Pulse Resp SpO2  02/08/16 0957 125/83 mmHg 98.4 F (36.9 C) Oral 78 18 100 %  02/08/16 0637 121/78 mmHg 98 F (36.7 C) Oral 85 - 96 %  02/07/16 2004 124/60 mmHg 98.5 F (36.9 C) Oral 84 18 98 %  02/07/16 1633 121/70 mmHg 98.6 F (37 C) Oral 88 18 96 %     Recent laboratory studies:  Recent Labs  02/07/16 0520 02/08/16 0728  WBC 12.0* 10.6*  HGB 13.3 12.1*  HCT 37.9* 35.1*  PLT 167 144*  NA 138  --   K 4.1  --   CL 102  --   CO2 29  --  BUN 10  --   CREATININE 0.88  --   GLUCOSE 164*  --   CALCIUM 8.9  --      Discharge Medications:     Medication List    TAKE these medications        ascorbic acid 1000 MG tablet  Commonly known as:  VITAMIN C  Take 2,000 mg by mouth daily.     aspirin EC 325 MG tablet  Take 1 tablet (325 mg total) by mouth 2 (two) times daily after a meal. Take x 1 month post op to decrease risk of blood clots.     escitalopram 10 MG tablet  Commonly known as:  LEXAPRO  Take 10 mg by mouth daily.     methocarbamol 750 MG tablet  Commonly known as:  ROBAXIN-750  Take 1 tablet (750 mg total) by mouth every 8 (eight) hours as needed for muscle spasms.     omeprazole 20 MG  capsule  Commonly known as:  PRILOSEC  Take 20 mg by mouth daily.     oxyCODONE-acetaminophen 5-325 MG tablet  Commonly known as:  PERCOCET/ROXICET  Take 1-2 tablets by mouth every 4 (four) hours as needed for severe pain.     triamcinolone cream 0.1 %  Commonly known as:  KENALOG  Apply 1 application topically 2 (two) times daily as needed (Grover's Disease).     valACYclovir 500 MG tablet  Commonly known as:  VALTREX  Take 500 mg by mouth daily.     zolpidem 10 MG tablet  Commonly known as:  AMBIEN  Take 10 mg by mouth at bedtime as needed for sleep.        Diagnostic Studies: Dg Chest 2 View  01/28/2016  CLINICAL DATA:  Reflux.  Preoperative study. EXAM: CHEST  2 VIEW COMPARISON:  No prior . FINDINGS: Mild cardiomegaly with normal pulmonary vascularity. No focal infiltrate . No pleural effusion or pneumothorax . Questionable nodular density is noted over the anterior upper chest on lateral view. Contrast-enhanced chest CT is suggested for further evaluation. No acute bony abnormality. Mild degenerative changes thoracic spine . IMPRESSION: 1. Questionable nodular density is noted over the anterior chest on lateral view only. Contrast-enhanced chest CT is suggested for further evaluation. 2. Mild cardiomegaly with normal pulmonary vascularity. Electronically Signed   By: Marcello Moores  Register   On: 01/28/2016 10:39   Ct Chest W Contrast  02/07/2016  CLINICAL DATA:  Abnormal finding on preop chest x-ray. EXAM: CT CHEST WITH CONTRAST TECHNIQUE: Multidetector CT imaging of the chest was performed during intravenous contrast administration. CONTRAST:  137mL ISOVUE-300 IOPAMIDOL (ISOVUE-300) INJECTION 61% COMPARISON:  None. FINDINGS: Cardiovascular: Normal heart size. Normal thoracic aorta. No mediastinal or hilar lymphadenopathy. Calcified mediastinal and right hilar lymph nodes as can be seen with prior granulomatous disease. Mediastinum/Nodes: No lymphadenopathy.  Normal esophagus. Lungs/Pleura:  No focal consolidation, pleural effusion or pneumothorax. 12 mm well-circumscribed pulmonary nodule in the anterior left upper lobe (image 64/ series 3). Upper Abdomen: No focal abnormality. Musculoskeletal: No acute osseous abnormality. No lytic or sclerotic osseous lesion. IMPRESSION: 1. 12 mm well-circumscribed pulmonary nodule in the anterior left upper lobe (image 64/ series 3). Recommend follow-up CT in 3 months or PET/CT for further evaluation. This recommendation follows the consensus statement: "Guidelines for Management of Small Pulmonary Nodules Detected on CT Scans: A Statement from the [employer]" as published in Radiology 2005; 237:395-400. Electronically Signed   By: Kathreen Devoid   On: 02/07/2016 15:03  Disposition: Home with home health PT      Discharge Instructions    CPM    Complete by:  As directed   Continuous passive motion machine (CPM):      Use the CPM from 0 to 70 degrees for 8 hours per day.      You may increase by 5-10 per day.  You may break it up into 2 or 3 sessions per day.      Use CPM for 1-2 weeks or until you are told to stop.     Call MD / Call 911    Complete by:  As directed   If you experience chest pain or shortness of breath, CALL 911 and be transported to the hospital emergency room.  If you develope a fever above 101 F, pus (white drainage) or increased drainage or redness at the wound, or calf pain, call your surgeon's office.     Constipation Prevention    Complete by:  As directed   Drink plenty of fluids.  Prune juice may be helpful.  You may use a stool softener, such as Colace (over the counter) 100 mg twice a day.  Use MiraLax (over the counter) for constipation as needed.     Diet general    Complete by:  As directed      Do not put a pillow under the knee. Place it under the heel.    Complete by:  As directed      Increase activity slowly as tolerated    Complete by:  As directed      Weight bearing as tolerated    Complete  by:  As directed   Laterality:  right  Extremity:  Lower           Follow-up Information    Follow up with GRAVES,JOHN L, MD. Schedule an appointment as soon as possible for a visit in 2 weeks.   Specialty:  Orthopedic Surgery   Contact information:   [place] [address] [phone]       Follow up with [facility].   Why:  Someone from [facility] will contact you to arrange start date and time for therapy.   Contact information:   [address] [place] [address] [phone]       Follow up with Melrose Nakayama, MD. Schedule an appointment as soon as possible for a visit in 1 month.   Specialty:  Cardiothoracic Surgery   Contact information:   [address] [place] [address] [phone]        Signed: Erlene Senters 02/08/2016, 10:20 AM

## 2016-02-08 NOTE — Progress Notes (Signed)
Subjective: 2 Days Post-Op Procedure(s) (LRB): TOTAL KNEE ARTHROPLASTY (Right) Patient reports pain as mild. Progressing with physical therapy. We appreciate Dr. Leonarda Salon help. He will follow-up with him in one month about his pulmonary nodule. The patient is taking by mouth and voiding okay.  Objective: Vital signs in last 24 hours: Temp:  [98 F (36.7 C)-98.6 F (37 C)] 98.4 F (36.9 C) (06/30 0957) Pulse Rate:  [78-88] 78 (06/30 0957) Resp:  [18] 18 (06/30 0957) BP: (121-125)/(60-83) 125/83 mmHg (06/30 0957) SpO2:  [96 %-100 %] 100 % (06/30 0957)  Intake/Output from previous day: 06/29 0701 - 06/30 0700 In: 720 [P.O.:720] Out: 1025 [Urine:1025] Intake/Output this shift:     Recent Labs  02/07/16 0520 02/08/16 0728  HGB 13.3 12.1*    Recent Labs  02/07/16 0520 02/08/16 0728  WBC 12.0* 10.6*  RBC 4.10* 3.74*  HCT 37.9* 35.1*  PLT 167 144*    Recent Labs  02/07/16 0520  NA 138  K 4.1  CL 102  CO2 29  BUN 10  CREATININE 0.88  GLUCOSE 164*  CALCIUM 8.9      Study Result     CLINICAL DATA: Abnormal finding on preop chest x-ray.  EXAM: CT CHEST WITH CONTRAST  TECHNIQUE: Multidetector CT imaging of the chest was performed during intravenous contrast administration.  CONTRAST: 124mL ISOVUE-300 IOPAMIDOL (ISOVUE-300) INJECTION 61%  COMPARISON: None.  FINDINGS: Cardiovascular: Normal heart size. Normal thoracic aorta. No mediastinal or hilar lymphadenopathy. Calcified mediastinal and right hilar lymph nodes as can be seen with prior granulomatous disease.  Mediastinum/Nodes: No lymphadenopathy. Normal esophagus.  Lungs/Pleura: No focal consolidation, pleural effusion or pneumothorax. 12 mm well-circumscribed pulmonary nodule in the anterior left upper lobe (image 64/ series 3).  Upper Abdomen: No focal abnormality.  Musculoskeletal: No acute osseous abnormality. No lytic or sclerotic osseous lesion.  IMPRESSION: 1. 12  mm well-circumscribed pulmonary nodule in the anterior left upper lobe (image 64/ series 3). Recommend follow-up CT in 3 months or PET/CT for further evaluation. This recommendation follows the consensus statement: "Guidelines for Management of Small Pulmonary Nodules Detected on CT Scans: A Statement from the [place]" as published in Radiology 2005; 237:395-400.   Electronically Signed  By: Kathreen Devoid  On: 02/07/2016 15:03    Right knee exam: Moderate effusion. Nothing tense. Range of motion -5 to 65 of flexion. Calf soft and nontender. Sensation intact distally Intact pulses distally Dorsiflexion/Plantar flexion intact Incision: dressing C/D/I Compartment soft  Assessment/Plan: 2 Days Post-Op Procedure(s) (LRB): TOTAL KNEE ARTHROPLASTY (Right) 12 mm pulmonary nodule left upper lobe evaluated by Dr. Roxan Hockey. Will need follow-up in one month. Up with therapy Discharge home with home health Aspirin 325 mg twice daily for DVT prophylaxis 1 month postop. Weight-bear as tolerated on right. He will use home CPM machine. Follow-up with Dr. Berenice Primas in 2 weeks. Chelsea Pedretti G 02/08/2016, 10:14 AM

## 2016-02-08 NOTE — Progress Notes (Signed)
Pt discharge education and instructions completed with pt and spouse at bedside. Both voices understanding denies any questions. Pt IV removed; right knee incision mepilex dsg remains clean, dry and intact with no active bleeding noted. Pt discharge home with spouse to transport him home. Pt's wife handed pt prescriptions for percocet, robaxin and aspirin. Pt knee immobilizer on during discharge. Pt transported off unit via wheelchair with belongings and spouse at side. Delia Heady RN

## 2016-02-09 DIAGNOSIS — L111 Transient acantholytic dermatosis [Grover]: Secondary | ICD-10-CM | POA: Diagnosis not present

## 2016-02-09 DIAGNOSIS — Z96651 Presence of right artificial knee joint: Secondary | ICD-10-CM | POA: Diagnosis not present

## 2016-02-09 DIAGNOSIS — M25561 Pain in right knee: Secondary | ICD-10-CM | POA: Diagnosis not present

## 2016-02-09 DIAGNOSIS — Z8614 Personal history of Methicillin resistant Staphylococcus aureus infection: Secondary | ICD-10-CM | POA: Diagnosis not present

## 2016-02-09 DIAGNOSIS — F419 Anxiety disorder, unspecified: Secondary | ICD-10-CM | POA: Diagnosis not present

## 2016-02-09 DIAGNOSIS — Z471 Aftercare following joint replacement surgery: Secondary | ICD-10-CM | POA: Diagnosis not present

## 2016-02-09 DIAGNOSIS — Z7982 Long term (current) use of aspirin: Secondary | ICD-10-CM | POA: Diagnosis not present

## 2016-02-09 DIAGNOSIS — K219 Gastro-esophageal reflux disease without esophagitis: Secondary | ICD-10-CM | POA: Diagnosis not present

## 2016-02-11 DIAGNOSIS — L111 Transient acantholytic dermatosis [Grover]: Secondary | ICD-10-CM | POA: Diagnosis not present

## 2016-02-11 DIAGNOSIS — K219 Gastro-esophageal reflux disease without esophagitis: Secondary | ICD-10-CM | POA: Diagnosis not present

## 2016-02-11 DIAGNOSIS — Z7982 Long term (current) use of aspirin: Secondary | ICD-10-CM | POA: Diagnosis not present

## 2016-02-11 DIAGNOSIS — Z96651 Presence of right artificial knee joint: Secondary | ICD-10-CM | POA: Diagnosis not present

## 2016-02-11 DIAGNOSIS — F419 Anxiety disorder, unspecified: Secondary | ICD-10-CM | POA: Diagnosis not present

## 2016-02-11 DIAGNOSIS — Z8614 Personal history of Methicillin resistant Staphylococcus aureus infection: Secondary | ICD-10-CM | POA: Diagnosis not present

## 2016-02-11 DIAGNOSIS — Z471 Aftercare following joint replacement surgery: Secondary | ICD-10-CM | POA: Diagnosis not present

## 2016-02-13 DIAGNOSIS — Z96651 Presence of right artificial knee joint: Secondary | ICD-10-CM | POA: Diagnosis not present

## 2016-02-13 DIAGNOSIS — Z7982 Long term (current) use of aspirin: Secondary | ICD-10-CM | POA: Diagnosis not present

## 2016-02-13 DIAGNOSIS — L111 Transient acantholytic dermatosis [Grover]: Secondary | ICD-10-CM | POA: Diagnosis not present

## 2016-02-13 DIAGNOSIS — K219 Gastro-esophageal reflux disease without esophagitis: Secondary | ICD-10-CM | POA: Diagnosis not present

## 2016-02-13 DIAGNOSIS — F419 Anxiety disorder, unspecified: Secondary | ICD-10-CM | POA: Diagnosis not present

## 2016-02-13 DIAGNOSIS — Z8614 Personal history of Methicillin resistant Staphylococcus aureus infection: Secondary | ICD-10-CM | POA: Diagnosis not present

## 2016-02-13 DIAGNOSIS — Z471 Aftercare following joint replacement surgery: Secondary | ICD-10-CM | POA: Diagnosis not present

## 2016-02-15 DIAGNOSIS — Z7982 Long term (current) use of aspirin: Secondary | ICD-10-CM | POA: Diagnosis not present

## 2016-02-15 DIAGNOSIS — L111 Transient acantholytic dermatosis [Grover]: Secondary | ICD-10-CM | POA: Diagnosis not present

## 2016-02-15 DIAGNOSIS — Z96651 Presence of right artificial knee joint: Secondary | ICD-10-CM | POA: Diagnosis not present

## 2016-02-15 DIAGNOSIS — K219 Gastro-esophageal reflux disease without esophagitis: Secondary | ICD-10-CM | POA: Diagnosis not present

## 2016-02-15 DIAGNOSIS — F419 Anxiety disorder, unspecified: Secondary | ICD-10-CM | POA: Diagnosis not present

## 2016-02-15 DIAGNOSIS — Z8614 Personal history of Methicillin resistant Staphylococcus aureus infection: Secondary | ICD-10-CM | POA: Diagnosis not present

## 2016-02-15 DIAGNOSIS — Z471 Aftercare following joint replacement surgery: Secondary | ICD-10-CM | POA: Diagnosis not present

## 2016-02-18 DIAGNOSIS — Z96651 Presence of right artificial knee joint: Secondary | ICD-10-CM | POA: Diagnosis not present

## 2016-02-18 DIAGNOSIS — Z471 Aftercare following joint replacement surgery: Secondary | ICD-10-CM | POA: Diagnosis not present

## 2016-02-18 DIAGNOSIS — Z7982 Long term (current) use of aspirin: Secondary | ICD-10-CM | POA: Diagnosis not present

## 2016-02-18 DIAGNOSIS — K219 Gastro-esophageal reflux disease without esophagitis: Secondary | ICD-10-CM | POA: Diagnosis not present

## 2016-02-18 DIAGNOSIS — F419 Anxiety disorder, unspecified: Secondary | ICD-10-CM | POA: Diagnosis not present

## 2016-02-18 DIAGNOSIS — L111 Transient acantholytic dermatosis [Grover]: Secondary | ICD-10-CM | POA: Diagnosis not present

## 2016-02-18 DIAGNOSIS — Z8614 Personal history of Methicillin resistant Staphylococcus aureus infection: Secondary | ICD-10-CM | POA: Diagnosis not present

## 2016-02-20 DIAGNOSIS — Z7982 Long term (current) use of aspirin: Secondary | ICD-10-CM | POA: Diagnosis not present

## 2016-02-20 DIAGNOSIS — Z8614 Personal history of Methicillin resistant Staphylococcus aureus infection: Secondary | ICD-10-CM | POA: Diagnosis not present

## 2016-02-20 DIAGNOSIS — L111 Transient acantholytic dermatosis [Grover]: Secondary | ICD-10-CM | POA: Diagnosis not present

## 2016-02-20 DIAGNOSIS — F419 Anxiety disorder, unspecified: Secondary | ICD-10-CM | POA: Diagnosis not present

## 2016-02-20 DIAGNOSIS — Z471 Aftercare following joint replacement surgery: Secondary | ICD-10-CM | POA: Diagnosis not present

## 2016-02-20 DIAGNOSIS — K219 Gastro-esophageal reflux disease without esophagitis: Secondary | ICD-10-CM | POA: Diagnosis not present

## 2016-02-20 DIAGNOSIS — Z96651 Presence of right artificial knee joint: Secondary | ICD-10-CM | POA: Diagnosis not present

## 2016-02-21 DIAGNOSIS — M1711 Unilateral primary osteoarthritis, right knee: Secondary | ICD-10-CM | POA: Diagnosis not present

## 2016-02-22 DIAGNOSIS — F419 Anxiety disorder, unspecified: Secondary | ICD-10-CM | POA: Diagnosis not present

## 2016-02-22 DIAGNOSIS — Z96651 Presence of right artificial knee joint: Secondary | ICD-10-CM | POA: Diagnosis not present

## 2016-02-22 DIAGNOSIS — Z8614 Personal history of Methicillin resistant Staphylococcus aureus infection: Secondary | ICD-10-CM | POA: Diagnosis not present

## 2016-02-22 DIAGNOSIS — Z7982 Long term (current) use of aspirin: Secondary | ICD-10-CM | POA: Diagnosis not present

## 2016-02-22 DIAGNOSIS — K219 Gastro-esophageal reflux disease without esophagitis: Secondary | ICD-10-CM | POA: Diagnosis not present

## 2016-02-22 DIAGNOSIS — Z471 Aftercare following joint replacement surgery: Secondary | ICD-10-CM | POA: Diagnosis not present

## 2016-02-22 DIAGNOSIS — L111 Transient acantholytic dermatosis [Grover]: Secondary | ICD-10-CM | POA: Diagnosis not present

## 2016-02-26 DIAGNOSIS — M25561 Pain in right knee: Secondary | ICD-10-CM | POA: Diagnosis not present

## 2016-02-26 DIAGNOSIS — M25661 Stiffness of right knee, not elsewhere classified: Secondary | ICD-10-CM | POA: Diagnosis not present

## 2016-02-26 DIAGNOSIS — Z96651 Presence of right artificial knee joint: Secondary | ICD-10-CM | POA: Diagnosis not present

## 2016-02-28 DIAGNOSIS — M25561 Pain in right knee: Secondary | ICD-10-CM | POA: Diagnosis not present

## 2016-02-28 DIAGNOSIS — Z96651 Presence of right artificial knee joint: Secondary | ICD-10-CM | POA: Diagnosis not present

## 2016-02-28 DIAGNOSIS — M25661 Stiffness of right knee, not elsewhere classified: Secondary | ICD-10-CM | POA: Diagnosis not present

## 2016-03-03 DIAGNOSIS — M25661 Stiffness of right knee, not elsewhere classified: Secondary | ICD-10-CM | POA: Diagnosis not present

## 2016-03-03 DIAGNOSIS — M25561 Pain in right knee: Secondary | ICD-10-CM | POA: Diagnosis not present

## 2016-03-03 DIAGNOSIS — Z96651 Presence of right artificial knee joint: Secondary | ICD-10-CM | POA: Diagnosis not present

## 2016-03-05 DIAGNOSIS — M25561 Pain in right knee: Secondary | ICD-10-CM | POA: Diagnosis not present

## 2016-03-05 DIAGNOSIS — Z96651 Presence of right artificial knee joint: Secondary | ICD-10-CM | POA: Diagnosis not present

## 2016-03-05 DIAGNOSIS — M25661 Stiffness of right knee, not elsewhere classified: Secondary | ICD-10-CM | POA: Diagnosis not present

## 2016-03-06 DIAGNOSIS — Z471 Aftercare following joint replacement surgery: Secondary | ICD-10-CM | POA: Diagnosis not present

## 2016-03-06 DIAGNOSIS — M25561 Pain in right knee: Secondary | ICD-10-CM | POA: Diagnosis not present

## 2016-03-07 DIAGNOSIS — Z125 Encounter for screening for malignant neoplasm of prostate: Secondary | ICD-10-CM | POA: Diagnosis not present

## 2016-03-07 DIAGNOSIS — Z0000 Encounter for general adult medical examination without abnormal findings: Secondary | ICD-10-CM | POA: Diagnosis not present

## 2016-03-10 DIAGNOSIS — M25561 Pain in right knee: Secondary | ICD-10-CM | POA: Diagnosis not present

## 2016-03-10 DIAGNOSIS — M25661 Stiffness of right knee, not elsewhere classified: Secondary | ICD-10-CM | POA: Diagnosis not present

## 2016-03-10 DIAGNOSIS — Z96651 Presence of right artificial knee joint: Secondary | ICD-10-CM | POA: Diagnosis not present

## 2016-03-12 DIAGNOSIS — R0609 Other forms of dyspnea: Secondary | ICD-10-CM | POA: Diagnosis not present

## 2016-03-12 DIAGNOSIS — M25561 Pain in right knee: Secondary | ICD-10-CM | POA: Diagnosis not present

## 2016-03-12 DIAGNOSIS — R918 Other nonspecific abnormal finding of lung field: Secondary | ICD-10-CM | POA: Diagnosis not present

## 2016-03-12 DIAGNOSIS — Z1389 Encounter for screening for other disorder: Secondary | ICD-10-CM | POA: Diagnosis not present

## 2016-03-12 DIAGNOSIS — M25661 Stiffness of right knee, not elsewhere classified: Secondary | ICD-10-CM | POA: Diagnosis not present

## 2016-03-12 DIAGNOSIS — L111 Transient acantholytic dermatosis [Grover]: Secondary | ICD-10-CM | POA: Diagnosis not present

## 2016-03-12 DIAGNOSIS — Z96651 Presence of right artificial knee joint: Secondary | ICD-10-CM | POA: Diagnosis not present

## 2016-03-12 DIAGNOSIS — Z0000 Encounter for general adult medical examination without abnormal findings: Secondary | ICD-10-CM | POA: Diagnosis not present

## 2016-03-12 DIAGNOSIS — M1990 Unspecified osteoarthritis, unspecified site: Secondary | ICD-10-CM | POA: Diagnosis not present

## 2016-03-17 DIAGNOSIS — M25561 Pain in right knee: Secondary | ICD-10-CM | POA: Diagnosis not present

## 2016-03-17 DIAGNOSIS — M25661 Stiffness of right knee, not elsewhere classified: Secondary | ICD-10-CM | POA: Diagnosis not present

## 2016-03-17 DIAGNOSIS — Z1212 Encounter for screening for malignant neoplasm of rectum: Secondary | ICD-10-CM | POA: Diagnosis not present

## 2016-03-17 DIAGNOSIS — Z96651 Presence of right artificial knee joint: Secondary | ICD-10-CM | POA: Diagnosis not present

## 2016-03-18 ENCOUNTER — Encounter: Payer: Self-pay | Admitting: Thoracic Surgery (Cardiothoracic Vascular Surgery)

## 2016-03-18 ENCOUNTER — Ambulatory Visit (INDEPENDENT_AMBULATORY_CARE_PROVIDER_SITE_OTHER): Payer: BLUE CROSS/BLUE SHIELD | Admitting: Thoracic Surgery (Cardiothoracic Vascular Surgery)

## 2016-03-18 ENCOUNTER — Other Ambulatory Visit: Payer: Self-pay | Admitting: *Deleted

## 2016-03-18 VITALS — BP 116/80 | HR 80 | Resp 20 | Ht 72.0 in | Wt 195.0 lb

## 2016-03-18 DIAGNOSIS — R911 Solitary pulmonary nodule: Secondary | ICD-10-CM | POA: Diagnosis not present

## 2016-03-18 NOTE — Progress Notes (Signed)
[address]       [place] [address]             [phone]       HPI: Micheal May returns today for follow-up regarding his left upper lobe nodule.  He has a 60 year old man who is having a knee replacement done in late June. He had a nodule noted on his preoperative chest x-ray. A CT confirmed a 1.2 cm round nodule in the anterior aspect of the left upper lobe.  He is a lifelong nonsmoker. He has not had any travel exposures.  Given that the nodule is well-circumscribed and he was recovering from an operation, I recommended that he come back in 6 weeks to further discuss management of the nodule.  He is doing well with his recovery from the surgery. He is still walking cane. He is back at work. He has not had any weight loss. He denies any chest pain, pressure, tightness, or shortness of breath.  Past Medical History:  Diagnosis Date  . Anxiety   . Arthritis    OA  . GERD (gastroesophageal reflux disease)   . Grover's disease   . MRSA (methicillin resistant staph aureus) culture positive    hx of due to ingrown hair    10  yrs ago  . PONV (postoperative nausea and vomiting)    Past Surgical History:  Procedure Laterality Date  . APPENDECTOMY    . KNEE SURGERY Bilateral   . TOTAL KNEE ARTHROPLASTY Right 02/06/2016  . TOTAL KNEE ARTHROPLASTY Right 02/06/2016   Procedure: TOTAL KNEE ARTHROPLASTY;  Surgeon: Dorna Leitz, MD;  Location: [facility];  Service: Orthopedics;  Laterality: Right;     Current Outpatient Prescriptions  Medication Sig Dispense Refill  . ascorbic acid (VITAMIN C) 1000 MG tablet Take 2,000 mg by mouth daily.    Marland Kitchen aspirin EC 325 MG tablet Take 1 tablet (325 mg total) by mouth 2 (two) times daily after a meal. Take x 1 month post op to decrease risk of blood clots. 60 tablet 0  . escitalopram (LEXAPRO) 10 MG tablet Take 10 mg by mouth daily.    . methocarbamol (ROBAXIN-750) 750 MG tablet Take 1 tablet (750 mg total) by mouth every 8  (eight) hours as needed for muscle spasms. 60 tablet 0  . omeprazole (PRILOSEC) 20 MG capsule Take 20 mg by mouth daily.    Marland Kitchen oxyCODONE-acetaminophen (PERCOCET/ROXICET) 5-325 MG tablet Take 1-2 tablets by mouth every 4 (four) hours as needed for severe pain. 60 tablet 0  . triamcinolone cream (KENALOG) 0.1 % Apply 1 application topically 2 (two) times daily as needed (Grover's Disease).    . valACYclovir (VALTREX) 500 MG tablet Take 500 mg by mouth daily.     Marland Kitchen zolpidem (AMBIEN) 10 MG tablet Take 10 mg by mouth at bedtime as needed for sleep.   2   No current facility-administered medications for this visit.     Physical Exam BP 116/80 (BP Location: Right Arm, Patient Position: Sitting, Cuff Size: Normal)   Pulse 80   Resp 20   Ht 6' (1.829 m)   Wt 195 lb (88.5 kg)   SpO2 99% Comment: RA  BMI 26.21 kg/m  60 year old man in no acute distress Well-developed and well-nourished Alert and oriented 3 with no focal deficits No cervical or subclavicular adenopathy Cardiac regular rate and rhythm normal S1 and S2 no rubs or murmurs Lungs clear with equal breath sounds bilaterally  Diagnostic Tests: CT CHEST WITH CONTRAST  TECHNIQUE: Multidetector CT imaging of the chest was performed during intravenous contrast administration.  CONTRAST:  132mL ISOVUE-300 IOPAMIDOL (ISOVUE-300) INJECTION 61%  COMPARISON:  None.  FINDINGS: Cardiovascular: Normal heart size. Normal thoracic aorta. No mediastinal or hilar lymphadenopathy. Calcified mediastinal and right hilar lymph nodes as can be seen with prior granulomatous disease.  Mediastinum/Nodes: No lymphadenopathy.  Normal esophagus.  Lungs/Pleura: No focal consolidation, pleural effusion or pneumothorax. 12 mm well-circumscribed pulmonary nodule in the anterior left upper lobe (image 64/ series 3).  Upper Abdomen: No focal abnormality.  Musculoskeletal: No acute osseous abnormality. No lytic or sclerotic osseous  lesion.  IMPRESSION: 1. 12 mm well-circumscribed pulmonary nodule in the anterior left upper lobe (image 64/ series 3). Recommend follow-up CT in 3 months or PET/CT for further evaluation. This recommendation follows the consensus statement: "Guidelines for Management of Small Pulmonary Nodules Detected on CT Scans: A Statement from the [employer]" as published in Radiology 2005; 237:395-400.   Electronically Signed   By: Kathreen Devoid   On: 02/07/2016 15:03 I personally reviewed the CT chest and concur with the findings as noted above.  Impression: Micheal May is a 60 year old gentleman who was incidentally found to have a 1.2 cm left upper lobe nodule on a preoperative chest x-ray.   This nodule is well-circumscribed and benign in appearance. I reviewed the CT with Mr. and Mrs. May. We discussed the differential diagnosis which includes hamartoma, carcinoid, granuloma, and carcinoma. I think the likelihood of carcinoma is relatively small.  We again discussed the options for management which included radiographic follow-up versus surgical resection. I did talk to him about the surgical resection and the incisions, hospital stay, and expectations for recovery.   My recommendation was that we repeat a scan in 6 weeks, but do a PET/CT. If there is any sign of growth or metabolic activity on PET CT then he would need surgical resection. If not then I think nodule can safely be followed.  Arthritis- status post total knee replacement. Followed by Dr. Berenice Primas  Plan: Return in 6 weeks with a PET/CT  Melrose Nakayama, MD [facility] [phone]

## 2016-03-19 DIAGNOSIS — M25661 Stiffness of right knee, not elsewhere classified: Secondary | ICD-10-CM | POA: Diagnosis not present

## 2016-03-19 DIAGNOSIS — Z96651 Presence of right artificial knee joint: Secondary | ICD-10-CM | POA: Diagnosis not present

## 2016-03-19 DIAGNOSIS — M25561 Pain in right knee: Secondary | ICD-10-CM | POA: Diagnosis not present

## 2016-03-24 DIAGNOSIS — M25661 Stiffness of right knee, not elsewhere classified: Secondary | ICD-10-CM | POA: Diagnosis not present

## 2016-03-24 DIAGNOSIS — M25561 Pain in right knee: Secondary | ICD-10-CM | POA: Diagnosis not present

## 2016-03-24 DIAGNOSIS — Z96651 Presence of right artificial knee joint: Secondary | ICD-10-CM | POA: Diagnosis not present

## 2016-03-26 DIAGNOSIS — M25661 Stiffness of right knee, not elsewhere classified: Secondary | ICD-10-CM | POA: Diagnosis not present

## 2016-03-26 DIAGNOSIS — M25561 Pain in right knee: Secondary | ICD-10-CM | POA: Diagnosis not present

## 2016-03-26 DIAGNOSIS — Z96651 Presence of right artificial knee joint: Secondary | ICD-10-CM | POA: Diagnosis not present

## 2016-03-31 DIAGNOSIS — Z96651 Presence of right artificial knee joint: Secondary | ICD-10-CM | POA: Diagnosis not present

## 2016-03-31 DIAGNOSIS — M25661 Stiffness of right knee, not elsewhere classified: Secondary | ICD-10-CM | POA: Diagnosis not present

## 2016-03-31 DIAGNOSIS — M25561 Pain in right knee: Secondary | ICD-10-CM | POA: Diagnosis not present

## 2016-04-02 DIAGNOSIS — M25661 Stiffness of right knee, not elsewhere classified: Secondary | ICD-10-CM | POA: Diagnosis not present

## 2016-04-02 DIAGNOSIS — M25561 Pain in right knee: Secondary | ICD-10-CM | POA: Diagnosis not present

## 2016-04-02 DIAGNOSIS — Z96651 Presence of right artificial knee joint: Secondary | ICD-10-CM | POA: Diagnosis not present

## 2016-04-07 DIAGNOSIS — M25661 Stiffness of right knee, not elsewhere classified: Secondary | ICD-10-CM | POA: Diagnosis not present

## 2016-04-07 DIAGNOSIS — M25561 Pain in right knee: Secondary | ICD-10-CM | POA: Diagnosis not present

## 2016-04-07 DIAGNOSIS — Z96651 Presence of right artificial knee joint: Secondary | ICD-10-CM | POA: Diagnosis not present

## 2016-04-09 DIAGNOSIS — M25661 Stiffness of right knee, not elsewhere classified: Secondary | ICD-10-CM | POA: Diagnosis not present

## 2016-04-09 DIAGNOSIS — M25561 Pain in right knee: Secondary | ICD-10-CM | POA: Diagnosis not present

## 2016-04-09 DIAGNOSIS — Z96651 Presence of right artificial knee joint: Secondary | ICD-10-CM | POA: Diagnosis not present

## 2016-04-15 DIAGNOSIS — M25661 Stiffness of right knee, not elsewhere classified: Secondary | ICD-10-CM | POA: Diagnosis not present

## 2016-04-15 DIAGNOSIS — Z96651 Presence of right artificial knee joint: Secondary | ICD-10-CM | POA: Diagnosis not present

## 2016-04-15 DIAGNOSIS — M25561 Pain in right knee: Secondary | ICD-10-CM | POA: Diagnosis not present

## 2016-04-17 DIAGNOSIS — M25661 Stiffness of right knee, not elsewhere classified: Secondary | ICD-10-CM | POA: Diagnosis not present

## 2016-04-17 DIAGNOSIS — Z96651 Presence of right artificial knee joint: Secondary | ICD-10-CM | POA: Diagnosis not present

## 2016-04-17 DIAGNOSIS — M1711 Unilateral primary osteoarthritis, right knee: Secondary | ICD-10-CM | POA: Diagnosis not present

## 2016-04-17 DIAGNOSIS — M25561 Pain in right knee: Secondary | ICD-10-CM | POA: Diagnosis not present

## 2016-04-17 DIAGNOSIS — M7061 Trochanteric bursitis, right hip: Secondary | ICD-10-CM | POA: Diagnosis not present

## 2016-04-17 DIAGNOSIS — Z471 Aftercare following joint replacement surgery: Secondary | ICD-10-CM | POA: Diagnosis not present

## 2016-04-23 DIAGNOSIS — Z96651 Presence of right artificial knee joint: Secondary | ICD-10-CM | POA: Diagnosis not present

## 2016-04-23 DIAGNOSIS — M25661 Stiffness of right knee, not elsewhere classified: Secondary | ICD-10-CM | POA: Diagnosis not present

## 2016-04-23 DIAGNOSIS — M25561 Pain in right knee: Secondary | ICD-10-CM | POA: Diagnosis not present

## 2016-04-25 DIAGNOSIS — Z96651 Presence of right artificial knee joint: Secondary | ICD-10-CM | POA: Diagnosis not present

## 2016-04-25 DIAGNOSIS — M25551 Pain in right hip: Secondary | ICD-10-CM | POA: Diagnosis not present

## 2016-04-25 DIAGNOSIS — M25661 Stiffness of right knee, not elsewhere classified: Secondary | ICD-10-CM | POA: Diagnosis not present

## 2016-04-28 DIAGNOSIS — M25561 Pain in right knee: Secondary | ICD-10-CM | POA: Diagnosis not present

## 2016-04-28 DIAGNOSIS — Z96651 Presence of right artificial knee joint: Secondary | ICD-10-CM | POA: Diagnosis not present

## 2016-04-28 DIAGNOSIS — M25661 Stiffness of right knee, not elsewhere classified: Secondary | ICD-10-CM | POA: Diagnosis not present

## 2016-04-29 ENCOUNTER — Ambulatory Visit (INDEPENDENT_AMBULATORY_CARE_PROVIDER_SITE_OTHER): Payer: BLUE CROSS/BLUE SHIELD | Admitting: Thoracic Surgery (Cardiothoracic Vascular Surgery)

## 2016-04-29 ENCOUNTER — Encounter: Payer: Self-pay | Admitting: Thoracic Surgery (Cardiothoracic Vascular Surgery)

## 2016-04-29 ENCOUNTER — Ambulatory Visit (HOSPITAL_COMMUNITY)
Admission: RE | Admit: 2016-04-29 | Discharge: 2016-04-29 | Disposition: A | Discharge status: Home / Self Care | Payer: BLUE CROSS/BLUE SHIELD | Source: Ambulatory Visit | Attending: Thoracic Surgery (Cardiothoracic Vascular Surgery) | Admitting: Thoracic Surgery (Cardiothoracic Vascular Surgery)

## 2016-04-29 VITALS — BP 127/83 | HR 63 | Resp 16 | Ht 72.0 in | Wt 195.0 lb

## 2016-04-29 DIAGNOSIS — R911 Solitary pulmonary nodule: Secondary | ICD-10-CM | POA: Insufficient documentation

## 2016-04-29 LAB — GLUCOSE, CAPILLARY: GLUCOSE-CAPILLARY: 106 mg/dL — AB (ref 65–99)

## 2016-04-29 MED ORDER — FLUDEOXYGLUCOSE F - 18 (FDG) INJECTION
9.7300 | Freq: Once | INTRAVENOUS | Status: AC | PRN
  Administered 2016-04-29: 9.73 via INTRAVENOUS

## 2016-04-29 NOTE — Progress Notes (Signed)
[address]       [place] [address]             [phone]       HPI: [name] May returns for follow-up regarding his left upper lobe nodule  [name] May is a 60 year old man who was found to have a left upper lobe lung nodule on a preoperative chest x-ray prior to a knee replacement in June. CT showed a 1.2 cm round nodule in the anterior aspect of the left upper lobe. He is a lifelong nonsmoker. Given the relatively benign appearance of the nodule on the fact that he was recovering from knee surgery, I advised him to follow-up with a PET CT at 3 months from the original scan.  In the interim since his last visit he's been doing well. He still doing rehabilitation for his knee. He is no longer walking with a cane. He has not had any fevers, chills, or sweats. He has not had any respiratory issues.  Past Medical History:  Diagnosis Date  . Anxiety   . Arthritis    OA  . GERD (gastroesophageal reflux disease)   . Grover's disease   . MRSA (methicillin resistant staph aureus) culture positive    hx of due to ingrown hair    10  yrs ago  . PONV (postoperative nausea and vomiting)       Current Outpatient Prescriptions  Medication Sig Dispense Refill  . ascorbic acid (VITAMIN C) 1000 MG tablet Take 2,000 mg by mouth daily.    Marland Kitchen escitalopram (LEXAPRO) 10 MG tablet Take 10 mg by mouth daily.    Marland Kitchen omeprazole (PRILOSEC) 20 MG capsule Take 20 mg by mouth daily.    . valACYclovir (VALTREX) 500 MG tablet Take 500 mg by mouth daily.     Marland Kitchen zolpidem (AMBIEN) 10 MG tablet Take 10 mg by mouth at bedtime as needed for sleep.   2   No current facility-administered medications for this visit.     Physical Exam BP 127/83   Pulse 63   Resp 16   Ht 6' (1.829 m)   Wt 195 lb (88.5 kg)   SpO2 99% Comment: RA  BMI 26.62 kg/m  60 year old man in no acute distress Well-developed well-nourished  Diagnostic Tests: NUCLEAR MEDICINE PET SKULL BASE TO  THIGH  TECHNIQUE: 9.7 mCi F-18 FDG was injected intravenously. Full-ring PET imaging was performed from the skull base to thigh after the radiotracer. CT data was obtained and used for attenuation correction and anatomic localization.  FASTING BLOOD GLUCOSE:  Value: 106 mg/dl  COMPARISON:  Chest CT on 02/07/2016  FINDINGS: NECK  No hypermetabolic lymph nodes in the neck.  CHEST  No hypermetabolic mediastinal or hilar nodes. No other hypermetabolic lymph nodes identified within the thorax. Calcified mediastinal lymph nodes are consistent with old granulomatous disease.  11 mm well-circumscribed noncalcified pulmonary nodule in the anterior left upper lobe on image 32/7 shows absence of FDG uptake, suggesting benign etiology. No other suspicious pulmonary nodules seen on CT images.  ABDOMEN/PELVIS  No abnormal hypermetabolic activity within the liver, pancreas, adrenal glands, or spleen. No hypermetabolic lymph nodes in the abdomen or pelvis. Mild aortic atherosclerosis noted.  SKELETON  No focal hypermetabolic activity to suggest skeletal metastasis.  IMPRESSION: 11 mm well-circumscribed pulmonary nodule in the anterior left upper lobe shows no significant FDG uptake, suggesting benign etiology. Recommend continued followup by chest CT without contrast in 6-12 months.   Electronically  Signed   By: Earle Gell M.D.   On: 04/29/2016 10:04  Impression: [name] May is a 60 year old man who is a lifelong nonsmoker who was found to have a 1.2 cm left upper lobe nodule on a chest x-ray was done prior to knee replacement about 3 months ago. This had a relatively benign appearance on CT, so we elected to follow.  On PET CT today the nodule remains well-circumscribed and has no FDG uptake, both of which are consistent with a benign etiology. He also has some calcified mediastinal lymph nodes which suggests this may be granulomatous disease related to a  previous fungal infection  There is no indication for surgery at this time.  I recommended that he return in 6 months with CT chest just to ensure continued stability. If that CT is normal, we will probably just check 1 more in a year.  Plan: Return in 6 months with CT chest  Melrose Nakayama, MD [facility] [phone]

## 2016-04-30 DIAGNOSIS — M25661 Stiffness of right knee, not elsewhere classified: Secondary | ICD-10-CM | POA: Diagnosis not present

## 2016-04-30 DIAGNOSIS — Z96651 Presence of right artificial knee joint: Secondary | ICD-10-CM | POA: Diagnosis not present

## 2016-04-30 DIAGNOSIS — M25561 Pain in right knee: Secondary | ICD-10-CM | POA: Diagnosis not present

## 2016-05-05 DIAGNOSIS — M25661 Stiffness of right knee, not elsewhere classified: Secondary | ICD-10-CM | POA: Diagnosis not present

## 2016-05-05 DIAGNOSIS — M25561 Pain in right knee: Secondary | ICD-10-CM | POA: Diagnosis not present

## 2016-05-05 DIAGNOSIS — Z96651 Presence of right artificial knee joint: Secondary | ICD-10-CM | POA: Diagnosis not present

## 2016-05-07 DIAGNOSIS — Z96651 Presence of right artificial knee joint: Secondary | ICD-10-CM | POA: Diagnosis not present

## 2016-05-07 DIAGNOSIS — M25661 Stiffness of right knee, not elsewhere classified: Secondary | ICD-10-CM | POA: Diagnosis not present

## 2016-05-07 DIAGNOSIS — M25561 Pain in right knee: Secondary | ICD-10-CM | POA: Diagnosis not present

## 2016-05-12 DIAGNOSIS — M25561 Pain in right knee: Secondary | ICD-10-CM | POA: Diagnosis not present

## 2016-05-12 DIAGNOSIS — M25661 Stiffness of right knee, not elsewhere classified: Secondary | ICD-10-CM | POA: Diagnosis not present

## 2016-05-12 DIAGNOSIS — Z96651 Presence of right artificial knee joint: Secondary | ICD-10-CM | POA: Diagnosis not present

## 2016-05-20 DIAGNOSIS — Z96651 Presence of right artificial knee joint: Secondary | ICD-10-CM | POA: Diagnosis not present

## 2016-05-20 DIAGNOSIS — M25661 Stiffness of right knee, not elsewhere classified: Secondary | ICD-10-CM | POA: Diagnosis not present

## 2016-05-20 DIAGNOSIS — M25561 Pain in right knee: Secondary | ICD-10-CM | POA: Diagnosis not present

## 2016-05-23 DIAGNOSIS — M25561 Pain in right knee: Secondary | ICD-10-CM | POA: Diagnosis not present

## 2016-05-23 DIAGNOSIS — M25661 Stiffness of right knee, not elsewhere classified: Secondary | ICD-10-CM | POA: Diagnosis not present

## 2016-05-23 DIAGNOSIS — Z96651 Presence of right artificial knee joint: Secondary | ICD-10-CM | POA: Diagnosis not present

## 2016-05-26 DIAGNOSIS — M25661 Stiffness of right knee, not elsewhere classified: Secondary | ICD-10-CM | POA: Diagnosis not present

## 2016-05-26 DIAGNOSIS — M25561 Pain in right knee: Secondary | ICD-10-CM | POA: Diagnosis not present

## 2016-05-26 DIAGNOSIS — Z96651 Presence of right artificial knee joint: Secondary | ICD-10-CM | POA: Diagnosis not present

## 2016-05-29 DIAGNOSIS — M25661 Stiffness of right knee, not elsewhere classified: Secondary | ICD-10-CM | POA: Diagnosis not present

## 2016-05-29 DIAGNOSIS — Z96651 Presence of right artificial knee joint: Secondary | ICD-10-CM | POA: Diagnosis not present

## 2016-05-29 DIAGNOSIS — M25561 Pain in right knee: Secondary | ICD-10-CM | POA: Diagnosis not present

## 2016-05-29 DIAGNOSIS — Z9889 Other specified postprocedural states: Secondary | ICD-10-CM | POA: Diagnosis not present

## 2016-09-25 DIAGNOSIS — M4722 Other spondylosis with radiculopathy, cervical region: Secondary | ICD-10-CM | POA: Diagnosis not present

## 2016-09-25 DIAGNOSIS — M25561 Pain in right knee: Secondary | ICD-10-CM | POA: Diagnosis not present

## 2016-09-25 DIAGNOSIS — M4692 Unspecified inflammatory spondylopathy, cervical region: Secondary | ICD-10-CM | POA: Diagnosis not present

## 2016-09-25 DIAGNOSIS — S46812A Strain of other muscles, fascia and tendons at shoulder and upper arm level, left arm, initial encounter: Secondary | ICD-10-CM | POA: Diagnosis not present

## 2016-09-25 DIAGNOSIS — M62838 Other muscle spasm: Secondary | ICD-10-CM | POA: Diagnosis not present

## 2016-09-25 DIAGNOSIS — S46811A Strain of other muscles, fascia and tendons at shoulder and upper arm level, right arm, initial encounter: Secondary | ICD-10-CM | POA: Diagnosis not present

## 2016-10-01 ENCOUNTER — Other Ambulatory Visit: Payer: Self-pay | Admitting: *Deleted

## 2016-10-01 DIAGNOSIS — R911 Solitary pulmonary nodule: Secondary | ICD-10-CM

## 2016-11-04 ENCOUNTER — Ambulatory Visit: Payer: BLUE CROSS/BLUE SHIELD | Admitting: Thoracic Surgery (Cardiothoracic Vascular Surgery)

## 2016-11-04 ENCOUNTER — Inpatient Hospital Stay: Admission: RE | Admit: 2016-11-04 | Payer: BLUE CROSS/BLUE SHIELD | Source: Ambulatory Visit

## 2016-11-11 DIAGNOSIS — M62838 Other muscle spasm: Secondary | ICD-10-CM | POA: Diagnosis not present

## 2016-11-11 DIAGNOSIS — M542 Cervicalgia: Secondary | ICD-10-CM | POA: Diagnosis not present

## 2017-02-02 ENCOUNTER — Ambulatory Visit
Admission: RE | Admit: 2017-02-02 | Discharge: 2017-02-02 | Disposition: A | Discharge status: Home / Self Care | Payer: BLUE CROSS/BLUE SHIELD | Source: Ambulatory Visit | Attending: Thoracic Surgery (Cardiothoracic Vascular Surgery) | Admitting: Thoracic Surgery (Cardiothoracic Vascular Surgery)

## 2017-02-02 DIAGNOSIS — R911 Solitary pulmonary nodule: Secondary | ICD-10-CM

## 2017-02-10 ENCOUNTER — Encounter: Payer: Self-pay | Admitting: Thoracic Surgery (Cardiothoracic Vascular Surgery)

## 2017-02-10 ENCOUNTER — Ambulatory Visit (INDEPENDENT_AMBULATORY_CARE_PROVIDER_SITE_OTHER): Payer: BLUE CROSS/BLUE SHIELD | Admitting: Thoracic Surgery (Cardiothoracic Vascular Surgery)

## 2017-02-10 VITALS — BP 129/85 | HR 73 | Resp 16 | Ht 72.0 in | Wt 201.0 lb

## 2017-02-10 DIAGNOSIS — R911 Solitary pulmonary nodule: Secondary | ICD-10-CM

## 2017-02-10 NOTE — Progress Notes (Signed)
[address]       [place] [address]             [phone]    HPI: [name] returns for follow-up of a left lung nodule  [name] is a 61 year old lifelong nonsmoker who was found to have a left upper lobe lung nodule on a preoperative chest x-ray prior to knee replacement in 2017. On CT there was a 1.2 cm round nodule in the anterior aspect of the left upper lobe. We did a follow-up at 3 months with a PET/CT and it showed no metabolic activity. He now returns for a 6 month follow-up.  He has been having some left-sided anterior chest pain is reproducible with palpation. This is not necessarily related to exertion. He has an occasional dry cough.  Past Medical History:  Diagnosis Date  . Anxiety   . Arthritis    OA  . GERD (gastroesophageal reflux disease)   . Grover's disease   . MRSA (methicillin resistant staph aureus) culture positive    hx of due to ingrown hair    10  yrs ago  . PONV (postoperative nausea and vomiting)      Current Outpatient Prescriptions  Medication Sig Dispense Refill  . ascorbic acid (VITAMIN C) 1000 MG tablet Take 2,000 mg by mouth daily.    Marland Kitchen escitalopram (LEXAPRO) 10 MG tablet Take 10 mg by mouth daily.    . eszopiclone (LUNESTA) 1 MG TABS tablet Take 1 mg by mouth at bedtime as needed for sleep. Take immediately before bedtime    . omeprazole (PRILOSEC) 20 MG capsule Take 20 mg by mouth daily.    . valACYclovir (VALTREX) 500 MG tablet Take 500 mg by mouth daily.      No current facility-administered medications for this visit.     Physical Exam BP 129/85   Pulse 73   Resp 16   Ht 6' (1.829 m)   Wt 201 lb (91.2 kg)   SpO2 96% Comment: ON RA  BMI 27.26 kg/m  Well-appearing 61 year old man in no acute distress Alert and oriented 3 with no focal deficits Cardiac regular rate and rhythm normal S1 and S2 no rubs or murmurs Lungs clear with equal breath sounds bilaterally Point tenderness to palpation along  lower portion of left sternal costal junction  Diagnostic Tests: CT CHEST WITHOUT CONTRAST  TECHNIQUE: Multidetector CT imaging of the chest was performed following the standard protocol without IV contrast.  COMPARISON:  Chest CT dated 02/07/2016 and PET-CT dated 04/29/2016.  FINDINGS: Cardiovascular: Heart size is normal. No pericardial effusion. Thoracic aorta is normal in caliber and configuration.  Mediastinum/Nodes: No mass or enlarged lymph nodes within the mediastinum or perihilar regions. Calcified lymph nodes again noted within the sub-carinal mediastinum. Esophagus appears normal. Trachea and central bronchi are unremarkable.  Lungs/Pleura: May 12 mm nodule within the anterior left upper lobe (series 4, image 52).  Lungs otherwise clear.  No pleural effusion.  Upper Abdomen: Limited images of the upper abdomen are unremarkable.  Musculoskeletal: Mild degenerative spurring within the thoracic spine. No acute or suspicious osseous finding. Superficial soft tissues are unremarkable.  IMPRESSION: 1. May 12 mm pulmonary nodule within the anterior left upper lobe. PET-CT of 04/29/2016 suggesting benign etiology. The 1 year stability also suggests benignity. Recommend additional follow-up chest CT in 12 months to ensure 2 year stability. 2. Remainder of the chest CT is unremarkable.  No acute findings.   Electronically Signed  By: Franki Cabot M.D.   On: 02/02/2017 08:41 I personally reviewed the CT images and concur with findings noted above  Impression: [name] is a 61 year old nonsmoker who was found to have a left upper lobe lung nodule during his preoperative evaluation prior to knee surgery about a year ago. The nodule was well circumscribed and benign in appearance. On PET CT there was no metabolic activity. I recommended follow-up to ensure stability.  On his CT today there has been no significant interval change last film in  September. I recommended that we repeat a CT in a year to ensure stability.  Chest wall pain- tender to palpation along the sternal costal junction consistent with costochondritis. I recommended nonsteroidal anti-inflammatories as needed.  Plan: Return in one year with CT chest  Melrose Nakayama, MD [facility] [phone]

## 2017-03-09 DIAGNOSIS — Z125 Encounter for screening for malignant neoplasm of prostate: Secondary | ICD-10-CM | POA: Diagnosis not present

## 2017-03-09 DIAGNOSIS — Z0000 Encounter for general adult medical examination without abnormal findings: Secondary | ICD-10-CM | POA: Diagnosis not present

## 2017-03-16 DIAGNOSIS — K589 Irritable bowel syndrome without diarrhea: Secondary | ICD-10-CM | POA: Diagnosis not present

## 2017-03-16 DIAGNOSIS — R1032 Left lower quadrant pain: Secondary | ICD-10-CM | POA: Diagnosis not present

## 2017-03-16 DIAGNOSIS — Z0000 Encounter for general adult medical examination without abnormal findings: Secondary | ICD-10-CM | POA: Diagnosis not present

## 2017-03-16 DIAGNOSIS — M179 Osteoarthritis of knee, unspecified: Secondary | ICD-10-CM | POA: Diagnosis not present

## 2017-03-16 DIAGNOSIS — Z1389 Encounter for screening for other disorder: Secondary | ICD-10-CM | POA: Diagnosis not present

## 2017-03-16 DIAGNOSIS — L111 Transient acantholytic dermatosis [Grover]: Secondary | ICD-10-CM | POA: Diagnosis not present

## 2017-03-17 DIAGNOSIS — Z1212 Encounter for screening for malignant neoplasm of rectum: Secondary | ICD-10-CM | POA: Diagnosis not present

## 2017-05-23 DIAGNOSIS — H5203 Hypermetropia, bilateral: Secondary | ICD-10-CM | POA: Diagnosis not present

## 2017-09-01 DIAGNOSIS — R10819 Abdominal tenderness, unspecified site: Secondary | ICD-10-CM | POA: Diagnosis not present

## 2017-09-01 DIAGNOSIS — Z6827 Body mass index (BMI) 27.0-27.9, adult: Secondary | ICD-10-CM | POA: Diagnosis not present

## 2017-09-06 ENCOUNTER — Other Ambulatory Visit: Payer: Self-pay | Admitting: Family Medicine

## 2017-09-06 DIAGNOSIS — R10819 Abdominal tenderness, unspecified site: Secondary | ICD-10-CM

## 2017-09-11 ENCOUNTER — Ambulatory Visit
Admission: RE | Admit: 2017-09-11 | Discharge: 2017-09-11 | Disposition: A | Discharge status: Home / Self Care | Payer: BLUE CROSS/BLUE SHIELD | Source: Ambulatory Visit | Attending: Family Medicine | Admitting: Family Medicine

## 2017-09-11 DIAGNOSIS — R109 Unspecified abdominal pain: Secondary | ICD-10-CM | POA: Diagnosis not present

## 2017-09-11 DIAGNOSIS — R10819 Abdominal tenderness, unspecified site: Secondary | ICD-10-CM

## 2017-09-11 MED ORDER — IOPAMIDOL (ISOVUE-300) INJECTION 61%
100.0000 mL | Freq: Once | INTRAVENOUS | Status: AC | PRN
  Administered 2017-09-11: 100 mL via INTRAVENOUS

## 2017-09-17 ENCOUNTER — Encounter: Payer: Self-pay | Admitting: Physician Assistant

## 2017-09-29 ENCOUNTER — Encounter: Payer: Self-pay | Admitting: Physician Assistant

## 2017-09-29 ENCOUNTER — Ambulatory Visit: Payer: BLUE CROSS/BLUE SHIELD | Admitting: Physician Assistant

## 2017-09-29 VITALS — BP 120/80 | HR 72 | Ht 72.0 in | Wt 202.6 lb

## 2017-09-29 DIAGNOSIS — R140 Abdominal distension (gaseous): Secondary | ICD-10-CM | POA: Diagnosis not present

## 2017-09-29 DIAGNOSIS — R1012 Left upper quadrant pain: Secondary | ICD-10-CM | POA: Diagnosis not present

## 2017-09-29 NOTE — Progress Notes (Addendum)
Chief Complaint: Left upper quadrant discomfort  HPI:    [name] is a 62 year old Caucasian male with a past medical history of anxiety and others listed below, who was referred to me by Velna Hatchet, MD for a complaint of left upper quadrant discomfort.      BMP 09/01/17 normal CT abdomen pelvis 09/11/17 showed no acute intra-abdominal or intrapelvic abnormality.  Aortic atherosclerosis.  Last Colonoscopy 11/06/11 at [facility].  No report found in the computer.    Today patient describes a "twitching/pressure" in the left upper quadrant.  Occurring over the past year or so but has increased in frequency over the past 6 months.  Describes in an area from the middle of the left side of his abdomen up to underneath his rib.  This is a "twitching/pushing", initially a couple times a day starting in August of last year and now happens throughout the day.  Around the holidays patient noted this increasing more with bloating as well.  Tends to feel slightly more when he is hungry or has just eaten.  Sometimes wakes up with this sensation in the morning but it does not wake him from his sleep.    History of reflux controlled with omeprazole 20 mg daily.    Patient denies heartburn, reflux, weight loss, change in bowel habits, increase in gas, change in diet or any medications.  Past Medical History:  Diagnosis Date  . Anxiety   . Arthritis    OA  . GERD (gastroesophageal reflux disease)   . Grover's disease   . MRSA (methicillin resistant staph aureus) culture positive    hx of due to ingrown hair    10  yrs ago  . PONV (postoperative nausea and vomiting)     Past Surgical History:  Procedure Laterality Date  . APPENDECTOMY    . KNEE SURGERY Bilateral   . TOTAL KNEE ARTHROPLASTY Right 02/06/2016  . TOTAL KNEE ARTHROPLASTY Right 02/06/2016   Procedure: TOTAL KNEE ARTHROPLASTY;  Surgeon: Dorna Leitz, MD;  Location: [facility];  Service: Orthopedics;  Laterality: Right;    Current Outpatient  Medications  Medication Sig Dispense Refill  . ascorbic acid (VITAMIN C) 1000 MG tablet Take 2,000 mg by mouth daily.    Marland Kitchen escitalopram (LEXAPRO) 10 MG tablet Take 10 mg by mouth daily.    . eszopiclone (LUNESTA) 1 MG TABS tablet Take 1 mg by mouth at bedtime as needed for sleep. Take immediately before bedtime    . omeprazole (PRILOSEC) 20 MG capsule Take 20 mg by mouth daily.    . valACYclovir (VALTREX) 500 MG tablet Take 500 mg by mouth daily.      No current facility-administered medications for this visit.     Allergies as of 09/29/2017  . (No Known Allergies)    Family History  Problem Relation Age of Onset  . Hypertension Mother     Social History   Socioeconomic History  . Marital status: Unknown    Spouse name: Not on file  . Number of children: Not on file  . Years of education: Not on file  . Highest education level: Not on file  Social Needs  . Financial resource strain: Not on file  . Food insecurity - worry: Not on file  . Food insecurity - inability: Not on file  . Transportation needs - medical: Not on file  . Transportation needs - non-medical: Not on file  Occupational History  . Not on file  Tobacco Use  . Smoking  status: Never Smoker  . Smokeless tobacco: Never Used  Substance and Sexual Activity  . Alcohol use: Yes    Comment: weekly  . Drug use: No  . Sexual activity: Not on file  Other Topics Concern  . Not on file  Social History Narrative  . Not on file    Review of Systems:    Constitutional: No weight loss, fever or chills Skin: No rash Cardiovascular: No chest pain Respiratory: No SOB  Gastrointestinal: See HPI and otherwise negative Genitourinary: No dysuria or change in urinary frequency Neurological: No headache Musculoskeletal: No new muscle or joint pain Hematologic: No bleeding or bruising Psychiatric: No history of depression or anxiety   Physical Exam:  Vital signs: BP 120/80   Pulse 72   Ht 6' (1.829 m)   Wt 202  lb 9.6 oz (91.9 kg)   BMI 27.48 kg/m    Constitutional:   Pleasant Caucasian male appears to be in NAD, Well developed, Well nourished, alert and cooperative Head:  Normocephalic and atraumatic. Eyes:   PEERL, EOMI. No icterus. Conjunctiva pink. Ears:  Normal auditory acuity. Neck:  Supple Throat: Oral cavity and pharynx without inflammation, swelling or lesion.  Respiratory: Respirations even and unlabored. Lungs clear to auscultation bilaterally.   No wheezes, crackles, or rhonchi.  Cardiovascular: Normal S1, S2. No MRG. Regular rate and rhythm. No peripheral edema, cyanosis or pallor.  Gastrointestinal:  Soft, nondistended, nontender. No rebound or guarding. Normal bowel sounds. No appreciable masses or hepatomegaly. Rectal:  Not performed.  Msk:  Symmetrical without gross deformities. Without edema, no deformity or joint abnormality.  Neurologic:  Alert and  oriented x4;  grossly normal neurologically.  Skin:   Dry and intact without significant lesions or rashes. Psychiatric:  Demonstrates good judgement and reason without abnormal affect or behaviors.  MOST RECENT LABS AND IMAGING: CBC    Component Value Date/Time   WBC 10.6 (H) 02/08/2016 0728   RBC 3.74 (L) 02/08/2016 0728   HGB 12.1 (L) 02/08/2016 0728   HCT 35.1 (L) 02/08/2016 0728   PLT 144 (L) 02/08/2016 0728   MCV 93.9 02/08/2016 0728   MCH 32.4 02/08/2016 0728   MCHC 34.5 02/08/2016 0728   RDW 13.2 02/08/2016 0728   LYMPHSABS 0.8 01/28/2016 0926   MONOABS 0.4 01/28/2016 0926   EOSABS 0.1 01/28/2016 0926   BASOSABS 0.0 01/28/2016 0926    CMP     Component Value Date/Time   NA 138 02/07/2016 0520   K 4.1 02/07/2016 0520   CL 102 02/07/2016 0520   CO2 29 02/07/2016 0520   GLUCOSE 164 (H) 02/07/2016 0520   BUN 10 02/07/2016 0520   CREATININE 0.88 02/07/2016 0520   CALCIUM 8.9 02/07/2016 0520   PROT 6.6 01/28/2016 0926   ALBUMIN 4.1 01/28/2016 0926   AST 20 01/28/2016 0926   ALT 34 01/28/2016 0926    ALKPHOS 70 01/28/2016 0926   BILITOT 0.9 01/28/2016 0926   GFRNONAA >60 02/07/2016 0520   GFRAA >60 02/07/2016 0520   EXAM: CT ABDOMEN AND PELVIS WITH CONTRAST 09/11/17  TECHNIQUE: Multidetector CT imaging of the abdomen and pelvis was performed using the standard protocol following bolus administration of intravenous contrast. Sagittal and coronal MPR images reconstructed from axial data set.  CONTRAST:  175mL ISOVUE-300 IOPAMIDOL (ISOVUE-300) INJECTION 61% IV. Dilute oral contrast.  COMPARISON:  PET-CT 04/28/2016  FINDINGS: Lower chest: Lung bases clear  Hepatobiliary: Liver and gallbladder normal appearance  Pancreas: Normal appearance  Spleen: Normal appearance  Adrenals/Urinary  Tract: Adrenal glands, kidneys, ureters, and bladder normal appearance.  Stomach/Bowel: Stomach normal appearance. Appendix surgically absent by history. Minimally prominent stool in rectum. Large and small bowel loops otherwise normal appearance  Vascular/Lymphatic: Atherosclerotic calcifications aorta. Aorta normal caliber. Vascular structures otherwise unremarkable. No adenopathy.  Reproductive: Unremarkable prostate gland and seminal vesicles  Other: No free air or free fluid. No hernia or inflammatory process.  Musculoskeletal: Mild osseous demineralization without focal abnormality.  IMPRESSION: No acute intra-abdominal or intrapelvic abnormalities.  Aortic Atherosclerosis (ICD10-I70.0).   Electronically Signed   By: Lavonia Dana M.D.   On: 09/11/2017 17:22  Assessment: 1.  Left upper quadrant discomfort: No pain, sensation of twitching/pushing, no change with eating or bowel movement, normal CT in February, no change in bowel habits, last colonoscopy 2013 at [facility] (we will try to get report); consider splenic flexure syndrome plus/minus IBS most likely 2. Bloating  Plan: 1.  We will request records from [facility] regarding last colonoscopy. 2.  Reassured the  patient that there are no alarm symptoms at this time. 3.  Would recommend doing a trial of Gas-X with meals 3 times a day for a week to see if this helps at all with possible excess gas in his system. 4.  If above is of no help, expressed that the patient should call our clinic and we could do a trial of Xifaxan 550 3 times daily times 2 weeks for possible SIBO with bloating. 5.  Patient to follow in clinic with me in 2 months.  If he continues to be bothered by the sensation will discuss a colonoscopy.  He was assigned to Dr. Carlean Purl today.  Ellouise Newer, PA-C [facility] 09/29/2017, 1:47 PM  Cc: Velna Hatchet, MD   Agree with Ms. Willman Cuny's evaluation and management.  Gatha Mayer, MD, [facility]

## 2017-09-29 NOTE — Patient Instructions (Signed)
Try gas X with meals for a week

## 2017-11-25 ENCOUNTER — Encounter: Payer: Self-pay | Admitting: Internal Medicine

## 2017-11-25 ENCOUNTER — Ambulatory Visit: Payer: BLUE CROSS/BLUE SHIELD | Admitting: Internal Medicine

## 2017-11-25 VITALS — BP 132/80 | HR 76 | Ht 72.0 in | Wt 201.0 lb

## 2017-11-25 DIAGNOSIS — K588 Other irritable bowel syndrome: Secondary | ICD-10-CM | POA: Diagnosis not present

## 2017-11-25 DIAGNOSIS — R1013 Epigastric pain: Secondary | ICD-10-CM | POA: Diagnosis not present

## 2017-11-25 DIAGNOSIS — K219 Gastro-esophageal reflux disease without esophagitis: Secondary | ICD-10-CM | POA: Diagnosis not present

## 2017-11-25 DIAGNOSIS — F439 Reaction to severe stress, unspecified: Secondary | ICD-10-CM

## 2017-11-25 MED ORDER — PANTOPRAZOLE SODIUM 40 MG PO TBEC: 40.0000 mg | DELAYED_RELEASE_TABLET | Freq: Every day | ORAL | 3 refills | Status: DC

## 2017-11-25 NOTE — Progress Notes (Signed)
Micheal May 62 y.o. Aug 08, 1956 [id number]  Assessment & Plan:   Encounter Diagnoses  Name Primary?  . Dyspepsia Yes  . Other irritable bowel syndrome   . Gastroesophageal reflux disease, esophagitis presence not specified   . Situational stress     I think he has a combination of dyspepsia and IBS.  I am going to try IB guard to see if that makes a difference for him.  If that does not work he will try changing PPI a printed prescription for pantoprazole 40 mg daily was given.  It is quite likely his situational stress he describes is influencing things.  There are no worrisome features here.  We did discuss upper endoscopy and decided not to pursue that at this time.  Depending upon the clinical course it could be needed and he is aware of the warning signs of dysphagia, unintentional weight loss and bleeding or anemia and to report them.  He is also had CT scanning which is reassuring.  A colonoscopy in 2013 is reportedly negative.  I asked him to contact me with the results of his therapeutic trial with IB Donald Prose using my chart or a phone call.  We have not scheduled follow-up at this time.  I appreciate the opportunity to care for this patient. CC: Velna Hatchet, MD   15 minutes time spent with patient > half in counseling coordination of care   Subjective:   Chief Complaint: Left upper quadrant pain  The patient presents for follow-up, he was seen by Ellouise Newer PA-C in February with complaints of left upper quadrant pain, was suggested he try Gas-X.  He had describes several months of symptoms of a twitching or a pressure-like sensation in this area that is there before he eats and intensifies with eating.  He is on chronic omeprazole for many years with well-controlled heartburn.  He reports a long history of irritable bowel syndrome since childhood and generally follows a very bland diet without any caffeine or citrus or spicy or tomato-based foods.  He has not lost  weight.  He does admit to significant life and/or work stress but did not elaborate.  Note that CT scanning earlier this year did not reveal any problems other than some atherosclerosis of the abdominal aorta on abdominal pelvic scan.   No Known Allergies Current Meds  Medication Sig  . ascorbic acid (VITAMIN C) 1000 MG tablet Take 2,000 mg by mouth daily.  Marland Kitchen escitalopram (LEXAPRO) 10 MG tablet Take 10 mg by mouth daily.  . eszopiclone (LUNESTA) 1 MG TABS tablet Take 2 mg by mouth at bedtime as needed for sleep. Take immediately before bedtime   . Flaxseed, Linseed, (FLAXSEED OIL MAX STR) 1300 MG CAPS Take 1 capsule by mouth daily.  . meloxicam (MOBIC) 15 MG tablet Take 1 tablet by mouth as needed.  . Multiple Vitamins-Minerals (ICAPS AREDS 2 PO) Take 1 capsule by mouth daily.  Marland Kitchen omeprazole (PRILOSEC) 20 MG capsule Take 20 mg by mouth daily.  . valACYclovir (VALTREX) 500 MG tablet Take 500 mg by mouth daily.    Past Medical History:  Diagnosis Date  . Anxiety   . Arthritis    OA  . GERD (gastroesophageal reflux disease)   . Grover's disease   . IBS (irritable bowel syndrome)   . MRSA (methicillin resistant staph aureus) culture positive    hx of due to ingrown hair    10  yrs ago  . PONV (postoperative nausea and vomiting)  Past Surgical History:  Procedure Laterality Date  . APPENDECTOMY    . COLONOSCOPY  2013   Negative screening exam at [facility]  . KNEE SURGERY Bilateral   . TOTAL KNEE ARTHROPLASTY Right 02/06/2016  . TOTAL KNEE ARTHROPLASTY Right 02/06/2016   Procedure: TOTAL KNEE ARTHROPLASTY;  Surgeon: Dorna Leitz, MD;  Location: [place];  Service: Orthopedics;  Laterality: Right;   Social History   Social History Narrative   The patient is the Health and safety inspector of [place]   Reports one child   Never smoker, weekly alcohol, no drug use   family history includes Hypertension in his mother.   Review of Systems As above  Objective:   Physical Exam BP 132/80    Pulse 76   Ht 6' (1.829 m)   Wt 201 lb (91.2 kg)   BMI 27.26 kg/m   NAD Eyes anicteric Abdomen soft NT no mass bowel sounds present Alert and oriented x3 Appropriate mood and affect

## 2017-11-25 NOTE — Patient Instructions (Addendum)
   Try the FDgard samples we are providing you with today.   If the FDgard helps continue this and if not stop the omeprazole and fill the pantoprazole rx.   MyChart [place] with how your doing.   We are giving you information on dyspepsia to read.   I appreciate the opportunity to care for you. Silvano Rusk, MD, [facility]

## 2018-01-20 ENCOUNTER — Other Ambulatory Visit: Payer: Self-pay | Admitting: *Deleted

## 2018-01-20 DIAGNOSIS — R911 Solitary pulmonary nodule: Secondary | ICD-10-CM

## 2018-02-03 DIAGNOSIS — M542 Cervicalgia: Secondary | ICD-10-CM | POA: Diagnosis not present

## 2018-02-17 DIAGNOSIS — M542 Cervicalgia: Secondary | ICD-10-CM | POA: Diagnosis not present

## 2018-02-19 DIAGNOSIS — M542 Cervicalgia: Secondary | ICD-10-CM | POA: Diagnosis not present

## 2018-02-23 ENCOUNTER — Ambulatory Visit: Payer: BLUE CROSS/BLUE SHIELD | Admitting: Thoracic Surgery (Cardiothoracic Vascular Surgery)

## 2018-02-23 ENCOUNTER — Ambulatory Visit
Admission: RE | Admit: 2018-02-23 | Discharge: 2018-02-23 | Disposition: A | Discharge status: Home / Self Care | Payer: BLUE CROSS/BLUE SHIELD | Source: Ambulatory Visit | Attending: Thoracic Surgery (Cardiothoracic Vascular Surgery) | Admitting: Thoracic Surgery (Cardiothoracic Vascular Surgery)

## 2018-02-23 VITALS — BP 133/82 | HR 70 | Resp 20 | Ht 72.0 in | Wt 205.0 lb

## 2018-02-23 DIAGNOSIS — R911 Solitary pulmonary nodule: Secondary | ICD-10-CM

## 2018-02-23 NOTE — Progress Notes (Signed)
[address]       [place] [address]             [phone]     HPI: Micheal May returns for a scheduled follow-up visit  Micheal May is a 62 year old gentleman who was found to have a left upper lobe lung nodule on a preoperative chest x-ray prior to knee replacement in 2017.  He is a lifelong non-smoker.  Not was noted on a chest x-ray.  On CT there was a 1.2 cm round nodule in the anterior aspect of the left upper lobe.  A follow-up PET CT at 3 months showed no metabolic activity.  I saw him again in July 2018.  The nodule was unchanged.  He has some right knee pain.  It does limit his ability to run or do heavy physical activities but does not interfere with walking.  He is not having any chest pain, shortness of breath, cough or wheezing.  Past Medical History:  Diagnosis Date  . Anxiety   . Arthritis    OA  . GERD (gastroesophageal reflux disease)   . Grover's disease   . IBS (irritable bowel syndrome)   . MRSA (methicillin resistant staph aureus) culture positive    hx of due to ingrown hair    10  yrs ago  . PONV (postoperative nausea and vomiting)     Current Outpatient Medications  Medication Sig Dispense Refill  . ascorbic acid (VITAMIN C) 1000 MG tablet Take 2,000 mg by mouth daily.    Marland Kitchen escitalopram (LEXAPRO) 10 MG tablet Take 10 mg by mouth daily.    . eszopiclone (LUNESTA) 1 MG TABS tablet Take 2 mg by mouth at bedtime as needed for sleep. Take immediately before bedtime     . Flaxseed, Linseed, (FLAXSEED OIL MAX STR) 1300 MG CAPS Take 1 capsule by mouth daily.    . meloxicam (MOBIC) 15 MG tablet Take 1 tablet by mouth as needed.  0  . Multiple Vitamins-Minerals (ICAPS AREDS 2 PO) Take 1 capsule by mouth daily.    Marland Kitchen omeprazole (PRILOSEC) 20 MG capsule Take 20 mg by mouth daily.    . valACYclovir (VALTREX) 500 MG tablet Take 500 mg by mouth daily.      No current facility-administered medications for this visit.     Physical Exam BP  133/82   Pulse 70   Resp 20   Ht 6' (1.829 m)   Wt 205 lb (93 kg)   SpO2 99% Comment: RA  BMI 27.42 kg/m  62 year old man in no acute distress Alert and oriented x3 with no focal deficits Lungs clear with equal breath sounds bilaterally Cardiac regular rate and rhythm normal S1 and S2  Diagnostic Tests: CT CHEST WITHOUT CONTRAST  TECHNIQUE: Multidetector CT imaging of the chest was performed following the standard protocol without IV contrast.  COMPARISON:  Chest CT 02/07/2016 and 02/02/2017.  PET-CT 04/29/2016.  FINDINGS: Cardiovascular: Minimal aortic atherosclerosis without acute vascular findings on noncontrast imaging. The heart size is normal. There is no pericardial effusion.  Mediastinum/Nodes: There are no enlarged mediastinal, hilar or axillary lymph nodes.There are stable calcified mediastinal lymph nodes. Hilar assessment is limited by the lack of intravenous contrast, although the hilar contours appear unchanged. The thyroid gland, trachea and esophagus demonstrate no significant findings.  Lungs/Pleura: There is no pleural effusion. There is a stable well-circumscribed subpleural nodule anteriorly in the left upper lobe, measuring 12 x 10 mm on image  72/8. This demonstrates faint calcifications (89 HU) and is most likely a granuloma based on the calcified mediastinal lymph nodes. There are additional calcified granulomas in the right upper lobe. No suspicious pulmonary nodules.  Upper abdomen: The visualized upper abdomen appears stable without suspicious findings. There are small splenic granulomas.  Musculoskeletal/Chest wall: There is no chest wall mass or suspicious osseous finding. Stable T8 hemangioma and scattered bone islands.  IMPRESSION: 1. Stable well-circumscribed left upper lobe nodule from baseline study of 02/07/2016. The stability, lack of metabolic activity on PET-CT, associated faint calcifications and calcified  mediastinal lymph nodes support a granuloma as the etiology. Per consensus guidelines, no additional follow-up necessary. This recommendation follows the consensus statement: Guidelines for Management of Small Pulmonary Nodules Detected on CT Images: From the Fleischner Society 2017; Radiology 2017; 284:228-243. 2. No acute chest findings.   Electronically Signed   By: Richardean Sale M.D.   On: 02/23/2018 14:07 I personally reviewed the CT images and concur with the findings noted above  Impression: Micheal May is a 62 year old man who was found to have a lung nodule in 2017.  This was a 1.2 cm left upper lobe nodule that was found on a preoperative chest radiograph.  The nodule was confirmed with a CT scan.  There was no metabolic activity on PET/CT.  He is been followed since that time with no change in the nodule.  He also has some calcified lymph nodes in the mediastinum.  Findings are consistent with granulomatous disease.  There is no need for further follow-up.  Plan: No need for further follow-up.  I will be happy to see him back again anytime the future if I can be of any further assistance with his care.  Melrose Nakayama, MD [facility] [phone]

## 2018-02-24 DIAGNOSIS — M542 Cervicalgia: Secondary | ICD-10-CM | POA: Diagnosis not present

## 2018-02-26 DIAGNOSIS — M542 Cervicalgia: Secondary | ICD-10-CM | POA: Diagnosis not present

## 2018-03-04 DIAGNOSIS — M25512 Pain in left shoulder: Secondary | ICD-10-CM | POA: Diagnosis not present

## 2018-03-04 DIAGNOSIS — M24812 Other specific joint derangements of left shoulder, not elsewhere classified: Secondary | ICD-10-CM | POA: Diagnosis not present

## 2018-03-05 DIAGNOSIS — M542 Cervicalgia: Secondary | ICD-10-CM | POA: Diagnosis not present

## 2018-03-12 DIAGNOSIS — M542 Cervicalgia: Secondary | ICD-10-CM | POA: Diagnosis not present

## 2018-03-19 DIAGNOSIS — M542 Cervicalgia: Secondary | ICD-10-CM | POA: Diagnosis not present

## 2018-03-22 DIAGNOSIS — Z125 Encounter for screening for malignant neoplasm of prostate: Secondary | ICD-10-CM | POA: Diagnosis not present

## 2018-03-22 DIAGNOSIS — Z0000 Encounter for general adult medical examination without abnormal findings: Secondary | ICD-10-CM | POA: Diagnosis not present

## 2018-03-26 DIAGNOSIS — M542 Cervicalgia: Secondary | ICD-10-CM | POA: Diagnosis not present

## 2018-03-30 DIAGNOSIS — M25512 Pain in left shoulder: Secondary | ICD-10-CM | POA: Diagnosis not present

## 2018-03-30 DIAGNOSIS — R1032 Left lower quadrant pain: Secondary | ICD-10-CM | POA: Diagnosis not present

## 2018-03-30 DIAGNOSIS — L111 Transient acantholytic dermatosis [Grover]: Secondary | ICD-10-CM | POA: Diagnosis not present

## 2018-03-30 DIAGNOSIS — Z0000 Encounter for general adult medical examination without abnormal findings: Secondary | ICD-10-CM | POA: Diagnosis not present

## 2018-03-30 DIAGNOSIS — Z1389 Encounter for screening for other disorder: Secondary | ICD-10-CM | POA: Diagnosis not present

## 2018-03-30 DIAGNOSIS — M1990 Unspecified osteoarthritis, unspecified site: Secondary | ICD-10-CM | POA: Diagnosis not present

## 2018-04-01 DIAGNOSIS — M67912 Unspecified disorder of synovium and tendon, left shoulder: Secondary | ICD-10-CM | POA: Diagnosis not present

## 2018-04-02 DIAGNOSIS — Z1212 Encounter for screening for malignant neoplasm of rectum: Secondary | ICD-10-CM | POA: Diagnosis not present

## 2018-04-09 DIAGNOSIS — M25512 Pain in left shoulder: Secondary | ICD-10-CM | POA: Diagnosis not present

## 2018-04-13 DIAGNOSIS — M67911 Unspecified disorder of synovium and tendon, right shoulder: Secondary | ICD-10-CM | POA: Diagnosis not present

## 2018-05-14 DIAGNOSIS — M5032 Other cervical disc degeneration, mid-cervical region, unspecified level: Secondary | ICD-10-CM | POA: Diagnosis not present

## 2018-05-14 DIAGNOSIS — M40202 Unspecified kyphosis, cervical region: Secondary | ICD-10-CM | POA: Diagnosis not present

## 2018-05-14 DIAGNOSIS — M9901 Segmental and somatic dysfunction of cervical region: Secondary | ICD-10-CM | POA: Diagnosis not present

## 2018-05-14 DIAGNOSIS — M47812 Spondylosis without myelopathy or radiculopathy, cervical region: Secondary | ICD-10-CM | POA: Diagnosis not present

## 2018-05-19 DIAGNOSIS — M40202 Unspecified kyphosis, cervical region: Secondary | ICD-10-CM | POA: Diagnosis not present

## 2018-05-19 DIAGNOSIS — M9901 Segmental and somatic dysfunction of cervical region: Secondary | ICD-10-CM | POA: Diagnosis not present

## 2018-05-19 DIAGNOSIS — M5032 Other cervical disc degeneration, mid-cervical region, unspecified level: Secondary | ICD-10-CM | POA: Diagnosis not present

## 2018-05-19 DIAGNOSIS — M47812 Spondylosis without myelopathy or radiculopathy, cervical region: Secondary | ICD-10-CM | POA: Diagnosis not present

## 2018-05-20 DIAGNOSIS — M9901 Segmental and somatic dysfunction of cervical region: Secondary | ICD-10-CM | POA: Diagnosis not present

## 2018-05-20 DIAGNOSIS — M5032 Other cervical disc degeneration, mid-cervical region, unspecified level: Secondary | ICD-10-CM | POA: Diagnosis not present

## 2018-05-20 DIAGNOSIS — M47812 Spondylosis without myelopathy or radiculopathy, cervical region: Secondary | ICD-10-CM | POA: Diagnosis not present

## 2018-05-20 DIAGNOSIS — M40202 Unspecified kyphosis, cervical region: Secondary | ICD-10-CM | POA: Diagnosis not present

## 2018-05-24 DIAGNOSIS — M5032 Other cervical disc degeneration, mid-cervical region, unspecified level: Secondary | ICD-10-CM | POA: Diagnosis not present

## 2018-05-24 DIAGNOSIS — M9901 Segmental and somatic dysfunction of cervical region: Secondary | ICD-10-CM | POA: Diagnosis not present

## 2018-05-24 DIAGNOSIS — M40202 Unspecified kyphosis, cervical region: Secondary | ICD-10-CM | POA: Diagnosis not present

## 2018-05-24 DIAGNOSIS — M47812 Spondylosis without myelopathy or radiculopathy, cervical region: Secondary | ICD-10-CM | POA: Diagnosis not present

## 2018-05-26 DIAGNOSIS — M47812 Spondylosis without myelopathy or radiculopathy, cervical region: Secondary | ICD-10-CM | POA: Diagnosis not present

## 2018-05-26 DIAGNOSIS — M5032 Other cervical disc degeneration, mid-cervical region, unspecified level: Secondary | ICD-10-CM | POA: Diagnosis not present

## 2018-05-26 DIAGNOSIS — M40202 Unspecified kyphosis, cervical region: Secondary | ICD-10-CM | POA: Diagnosis not present

## 2018-05-26 DIAGNOSIS — M9901 Segmental and somatic dysfunction of cervical region: Secondary | ICD-10-CM | POA: Diagnosis not present

## 2018-05-27 DIAGNOSIS — M40202 Unspecified kyphosis, cervical region: Secondary | ICD-10-CM | POA: Diagnosis not present

## 2018-05-27 DIAGNOSIS — M9901 Segmental and somatic dysfunction of cervical region: Secondary | ICD-10-CM | POA: Diagnosis not present

## 2018-05-27 DIAGNOSIS — M47812 Spondylosis without myelopathy or radiculopathy, cervical region: Secondary | ICD-10-CM | POA: Diagnosis not present

## 2018-05-27 DIAGNOSIS — M5032 Other cervical disc degeneration, mid-cervical region, unspecified level: Secondary | ICD-10-CM | POA: Diagnosis not present

## 2018-05-31 DIAGNOSIS — M9901 Segmental and somatic dysfunction of cervical region: Secondary | ICD-10-CM | POA: Diagnosis not present

## 2018-05-31 DIAGNOSIS — M47812 Spondylosis without myelopathy or radiculopathy, cervical region: Secondary | ICD-10-CM | POA: Diagnosis not present

## 2018-05-31 DIAGNOSIS — M5032 Other cervical disc degeneration, mid-cervical region, unspecified level: Secondary | ICD-10-CM | POA: Diagnosis not present

## 2018-05-31 DIAGNOSIS — M40202 Unspecified kyphosis, cervical region: Secondary | ICD-10-CM | POA: Diagnosis not present

## 2018-06-02 DIAGNOSIS — M47812 Spondylosis without myelopathy or radiculopathy, cervical region: Secondary | ICD-10-CM | POA: Diagnosis not present

## 2018-06-02 DIAGNOSIS — M40202 Unspecified kyphosis, cervical region: Secondary | ICD-10-CM | POA: Diagnosis not present

## 2018-06-02 DIAGNOSIS — M9901 Segmental and somatic dysfunction of cervical region: Secondary | ICD-10-CM | POA: Diagnosis not present

## 2018-06-02 DIAGNOSIS — M5032 Other cervical disc degeneration, mid-cervical region, unspecified level: Secondary | ICD-10-CM | POA: Diagnosis not present

## 2018-06-03 DIAGNOSIS — M5032 Other cervical disc degeneration, mid-cervical region, unspecified level: Secondary | ICD-10-CM | POA: Diagnosis not present

## 2018-06-03 DIAGNOSIS — M47812 Spondylosis without myelopathy or radiculopathy, cervical region: Secondary | ICD-10-CM | POA: Diagnosis not present

## 2018-06-03 DIAGNOSIS — M40202 Unspecified kyphosis, cervical region: Secondary | ICD-10-CM | POA: Diagnosis not present

## 2018-06-03 DIAGNOSIS — M9901 Segmental and somatic dysfunction of cervical region: Secondary | ICD-10-CM | POA: Diagnosis not present

## 2018-06-07 DIAGNOSIS — M47812 Spondylosis without myelopathy or radiculopathy, cervical region: Secondary | ICD-10-CM | POA: Diagnosis not present

## 2018-06-07 DIAGNOSIS — M40202 Unspecified kyphosis, cervical region: Secondary | ICD-10-CM | POA: Diagnosis not present

## 2018-06-07 DIAGNOSIS — M5032 Other cervical disc degeneration, mid-cervical region, unspecified level: Secondary | ICD-10-CM | POA: Diagnosis not present

## 2018-06-07 DIAGNOSIS — M9901 Segmental and somatic dysfunction of cervical region: Secondary | ICD-10-CM | POA: Diagnosis not present

## 2018-06-09 DIAGNOSIS — M5032 Other cervical disc degeneration, mid-cervical region, unspecified level: Secondary | ICD-10-CM | POA: Diagnosis not present

## 2018-06-09 DIAGNOSIS — M47812 Spondylosis without myelopathy or radiculopathy, cervical region: Secondary | ICD-10-CM | POA: Diagnosis not present

## 2018-06-09 DIAGNOSIS — M9901 Segmental and somatic dysfunction of cervical region: Secondary | ICD-10-CM | POA: Diagnosis not present

## 2018-06-09 DIAGNOSIS — M40202 Unspecified kyphosis, cervical region: Secondary | ICD-10-CM | POA: Diagnosis not present

## 2018-06-10 DIAGNOSIS — M40202 Unspecified kyphosis, cervical region: Secondary | ICD-10-CM | POA: Diagnosis not present

## 2018-06-10 DIAGNOSIS — M5032 Other cervical disc degeneration, mid-cervical region, unspecified level: Secondary | ICD-10-CM | POA: Diagnosis not present

## 2018-06-10 DIAGNOSIS — M47812 Spondylosis without myelopathy or radiculopathy, cervical region: Secondary | ICD-10-CM | POA: Diagnosis not present

## 2018-06-10 DIAGNOSIS — M9901 Segmental and somatic dysfunction of cervical region: Secondary | ICD-10-CM | POA: Diagnosis not present

## 2018-06-11 HISTORY — PX: OTHER SURGICAL HISTORY: SHX169

## 2018-06-14 DIAGNOSIS — M9901 Segmental and somatic dysfunction of cervical region: Secondary | ICD-10-CM | POA: Diagnosis not present

## 2018-06-14 DIAGNOSIS — M40202 Unspecified kyphosis, cervical region: Secondary | ICD-10-CM | POA: Diagnosis not present

## 2018-06-14 DIAGNOSIS — M5032 Other cervical disc degeneration, mid-cervical region, unspecified level: Secondary | ICD-10-CM | POA: Diagnosis not present

## 2018-06-14 DIAGNOSIS — M47812 Spondylosis without myelopathy or radiculopathy, cervical region: Secondary | ICD-10-CM | POA: Diagnosis not present

## 2018-06-21 DIAGNOSIS — M5032 Other cervical disc degeneration, mid-cervical region, unspecified level: Secondary | ICD-10-CM | POA: Diagnosis not present

## 2018-06-21 DIAGNOSIS — M9901 Segmental and somatic dysfunction of cervical region: Secondary | ICD-10-CM | POA: Diagnosis not present

## 2018-06-21 DIAGNOSIS — M40202 Unspecified kyphosis, cervical region: Secondary | ICD-10-CM | POA: Diagnosis not present

## 2018-06-21 DIAGNOSIS — M47812 Spondylosis without myelopathy or radiculopathy, cervical region: Secondary | ICD-10-CM | POA: Diagnosis not present

## 2018-06-28 DIAGNOSIS — R972 Elevated prostate specific antigen [PSA]: Secondary | ICD-10-CM | POA: Diagnosis not present

## 2018-07-01 DIAGNOSIS — M5032 Other cervical disc degeneration, mid-cervical region, unspecified level: Secondary | ICD-10-CM | POA: Diagnosis not present

## 2018-07-01 DIAGNOSIS — M9901 Segmental and somatic dysfunction of cervical region: Secondary | ICD-10-CM | POA: Diagnosis not present

## 2018-07-01 DIAGNOSIS — M40202 Unspecified kyphosis, cervical region: Secondary | ICD-10-CM | POA: Diagnosis not present

## 2018-07-01 DIAGNOSIS — M47812 Spondylosis without myelopathy or radiculopathy, cervical region: Secondary | ICD-10-CM | POA: Diagnosis not present

## 2018-07-05 DIAGNOSIS — M40202 Unspecified kyphosis, cervical region: Secondary | ICD-10-CM | POA: Diagnosis not present

## 2018-07-05 DIAGNOSIS — M5032 Other cervical disc degeneration, mid-cervical region, unspecified level: Secondary | ICD-10-CM | POA: Diagnosis not present

## 2018-07-05 DIAGNOSIS — M9901 Segmental and somatic dysfunction of cervical region: Secondary | ICD-10-CM | POA: Diagnosis not present

## 2018-07-05 DIAGNOSIS — M47812 Spondylosis without myelopathy or radiculopathy, cervical region: Secondary | ICD-10-CM | POA: Diagnosis not present

## 2018-07-06 DIAGNOSIS — M5032 Other cervical disc degeneration, mid-cervical region, unspecified level: Secondary | ICD-10-CM | POA: Diagnosis not present

## 2018-07-06 DIAGNOSIS — M9901 Segmental and somatic dysfunction of cervical region: Secondary | ICD-10-CM | POA: Diagnosis not present

## 2018-07-06 DIAGNOSIS — M40202 Unspecified kyphosis, cervical region: Secondary | ICD-10-CM | POA: Diagnosis not present

## 2018-07-06 DIAGNOSIS — M47812 Spondylosis without myelopathy or radiculopathy, cervical region: Secondary | ICD-10-CM | POA: Diagnosis not present

## 2018-07-07 DIAGNOSIS — M7542 Impingement syndrome of left shoulder: Secondary | ICD-10-CM | POA: Diagnosis not present

## 2018-07-07 DIAGNOSIS — M7552 Bursitis of left shoulder: Secondary | ICD-10-CM | POA: Diagnosis not present

## 2018-07-07 DIAGNOSIS — M19012 Primary osteoarthritis, left shoulder: Secondary | ICD-10-CM | POA: Diagnosis not present

## 2018-07-07 DIAGNOSIS — G8918 Other acute postprocedural pain: Secondary | ICD-10-CM | POA: Diagnosis not present

## 2018-07-15 DIAGNOSIS — Z9889 Other specified postprocedural states: Secondary | ICD-10-CM | POA: Diagnosis not present

## 2018-07-23 DIAGNOSIS — M25612 Stiffness of left shoulder, not elsewhere classified: Secondary | ICD-10-CM | POA: Diagnosis not present

## 2018-07-23 DIAGNOSIS — M6281 Muscle weakness (generalized): Secondary | ICD-10-CM | POA: Diagnosis not present

## 2018-07-28 DIAGNOSIS — M40202 Unspecified kyphosis, cervical region: Secondary | ICD-10-CM | POA: Diagnosis not present

## 2018-07-28 DIAGNOSIS — M25612 Stiffness of left shoulder, not elsewhere classified: Secondary | ICD-10-CM | POA: Diagnosis not present

## 2018-07-28 DIAGNOSIS — M9901 Segmental and somatic dysfunction of cervical region: Secondary | ICD-10-CM | POA: Diagnosis not present

## 2018-07-28 DIAGNOSIS — M5032 Other cervical disc degeneration, mid-cervical region, unspecified level: Secondary | ICD-10-CM | POA: Diagnosis not present

## 2018-07-28 DIAGNOSIS — M6281 Muscle weakness (generalized): Secondary | ICD-10-CM | POA: Diagnosis not present

## 2018-07-28 DIAGNOSIS — M47812 Spondylosis without myelopathy or radiculopathy, cervical region: Secondary | ICD-10-CM | POA: Diagnosis not present

## 2018-07-29 DIAGNOSIS — M47812 Spondylosis without myelopathy or radiculopathy, cervical region: Secondary | ICD-10-CM | POA: Diagnosis not present

## 2018-07-29 DIAGNOSIS — M5032 Other cervical disc degeneration, mid-cervical region, unspecified level: Secondary | ICD-10-CM | POA: Diagnosis not present

## 2018-07-29 DIAGNOSIS — M19012 Primary osteoarthritis, left shoulder: Secondary | ICD-10-CM | POA: Diagnosis not present

## 2018-07-29 DIAGNOSIS — M40202 Unspecified kyphosis, cervical region: Secondary | ICD-10-CM | POA: Diagnosis not present

## 2018-07-29 DIAGNOSIS — M9901 Segmental and somatic dysfunction of cervical region: Secondary | ICD-10-CM | POA: Diagnosis not present

## 2018-07-29 DIAGNOSIS — Z9889 Other specified postprocedural states: Secondary | ICD-10-CM | POA: Diagnosis not present

## 2018-07-30 DIAGNOSIS — M6281 Muscle weakness (generalized): Secondary | ICD-10-CM | POA: Diagnosis not present

## 2018-07-30 DIAGNOSIS — M25612 Stiffness of left shoulder, not elsewhere classified: Secondary | ICD-10-CM | POA: Diagnosis not present

## 2018-08-02 DIAGNOSIS — M6281 Muscle weakness (generalized): Secondary | ICD-10-CM | POA: Diagnosis not present

## 2018-08-02 DIAGNOSIS — M25612 Stiffness of left shoulder, not elsewhere classified: Secondary | ICD-10-CM | POA: Diagnosis not present

## 2018-08-06 DIAGNOSIS — M6281 Muscle weakness (generalized): Secondary | ICD-10-CM | POA: Diagnosis not present

## 2018-08-06 DIAGNOSIS — M25612 Stiffness of left shoulder, not elsewhere classified: Secondary | ICD-10-CM | POA: Diagnosis not present

## 2018-08-09 DIAGNOSIS — M40202 Unspecified kyphosis, cervical region: Secondary | ICD-10-CM | POA: Diagnosis not present

## 2018-08-09 DIAGNOSIS — M9901 Segmental and somatic dysfunction of cervical region: Secondary | ICD-10-CM | POA: Diagnosis not present

## 2018-08-09 DIAGNOSIS — M5032 Other cervical disc degeneration, mid-cervical region, unspecified level: Secondary | ICD-10-CM | POA: Diagnosis not present

## 2018-08-09 DIAGNOSIS — M47812 Spondylosis without myelopathy or radiculopathy, cervical region: Secondary | ICD-10-CM | POA: Diagnosis not present

## 2018-08-10 DIAGNOSIS — M47812 Spondylosis without myelopathy or radiculopathy, cervical region: Secondary | ICD-10-CM | POA: Diagnosis not present

## 2018-08-10 DIAGNOSIS — M9901 Segmental and somatic dysfunction of cervical region: Secondary | ICD-10-CM | POA: Diagnosis not present

## 2018-08-10 DIAGNOSIS — M40202 Unspecified kyphosis, cervical region: Secondary | ICD-10-CM | POA: Diagnosis not present

## 2018-08-10 DIAGNOSIS — M5032 Other cervical disc degeneration, mid-cervical region, unspecified level: Secondary | ICD-10-CM | POA: Diagnosis not present

## 2019-03-28 DIAGNOSIS — Z125 Encounter for screening for malignant neoplasm of prostate: Secondary | ICD-10-CM | POA: Diagnosis not present

## 2019-03-28 DIAGNOSIS — Z0000 Encounter for general adult medical examination without abnormal findings: Secondary | ICD-10-CM | POA: Diagnosis not present

## 2019-03-28 DIAGNOSIS — N529 Male erectile dysfunction, unspecified: Secondary | ICD-10-CM | POA: Diagnosis not present

## 2019-03-28 DIAGNOSIS — R7989 Other specified abnormal findings of blood chemistry: Secondary | ICD-10-CM | POA: Diagnosis not present

## 2019-03-29 DIAGNOSIS — D2262 Melanocytic nevi of left upper limb, including shoulder: Secondary | ICD-10-CM | POA: Diagnosis not present

## 2019-03-29 DIAGNOSIS — R82998 Other abnormal findings in urine: Secondary | ICD-10-CM | POA: Diagnosis not present

## 2019-03-29 DIAGNOSIS — L821 Other seborrheic keratosis: Secondary | ICD-10-CM | POA: Diagnosis not present

## 2019-03-29 DIAGNOSIS — L918 Other hypertrophic disorders of the skin: Secondary | ICD-10-CM | POA: Diagnosis not present

## 2019-03-29 DIAGNOSIS — D2261 Melanocytic nevi of right upper limb, including shoulder: Secondary | ICD-10-CM | POA: Diagnosis not present

## 2019-04-13 DIAGNOSIS — Z1331 Encounter for screening for depression: Secondary | ICD-10-CM | POA: Diagnosis not present

## 2019-04-13 DIAGNOSIS — G4700 Insomnia, unspecified: Secondary | ICD-10-CM | POA: Diagnosis not present

## 2019-04-13 DIAGNOSIS — K219 Gastro-esophageal reflux disease without esophagitis: Secondary | ICD-10-CM | POA: Diagnosis not present

## 2019-04-13 DIAGNOSIS — Z0000 Encounter for general adult medical examination without abnormal findings: Secondary | ICD-10-CM | POA: Diagnosis not present

## 2019-04-13 DIAGNOSIS — F418 Other specified anxiety disorders: Secondary | ICD-10-CM | POA: Diagnosis not present

## 2019-04-13 DIAGNOSIS — R972 Elevated prostate specific antigen [PSA]: Secondary | ICD-10-CM | POA: Diagnosis not present

## 2019-05-04 DIAGNOSIS — Z1212 Encounter for screening for malignant neoplasm of rectum: Secondary | ICD-10-CM | POA: Diagnosis not present

## 2019-05-04 LAB — IFOBT (OCCULT BLOOD): IFOBT: NEGATIVE

## 2019-05-10 DIAGNOSIS — H43813 Vitreous degeneration, bilateral: Secondary | ICD-10-CM | POA: Diagnosis not present

## 2019-05-10 DIAGNOSIS — H353132 Nonexudative age-related macular degeneration, bilateral, intermediate dry stage: Secondary | ICD-10-CM | POA: Diagnosis not present

## 2019-06-30 DIAGNOSIS — M79662 Pain in left lower leg: Secondary | ICD-10-CM | POA: Diagnosis not present

## 2019-10-31 IMAGING — CT CT ABD-PELV W/ CM
3 of 5 series · 13 of 36 positions shown, 19 images · IV contrast ([ID] ISOVUE 300)
Comparison: PET-CT 04/28/2016

CLINICAL DATA: Left side abdominal pain for one year; history of a
LEFT upper lobe pulmonary nodule

EXAM:
CT ABDOMEN AND PELVIS WITH CONTRAST
TECHNIQUE: Multidetector CT imaging of the abdomen and pelvis was performed
using the standard protocol following bolus administration of
intravenous contrast. Sagittal and coronal MPR images reconstructed
from axial data set.
CONTRAST:  100mL THTYEO-2DD IOPAMIDOL (THTYEO-2DD) INJECTION 61% IV.
Dilute oral contrast.

[Series 3: abd/pelvis with · axial · 0.80mm/px · z∈[-364,-34]mm · 7 of 88 slices shown, 12 images]
[im 11/88  soft-tissue]
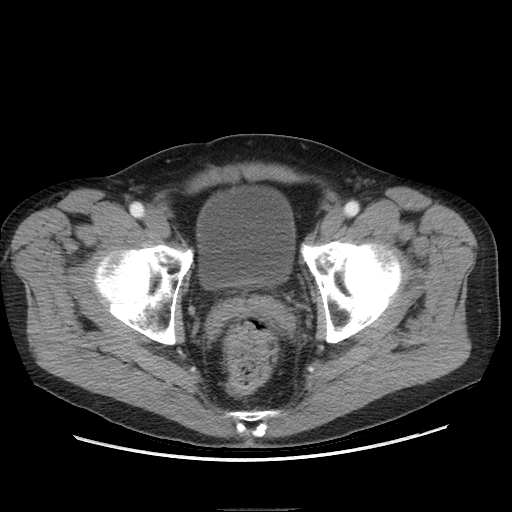
[im 11/88  bone]
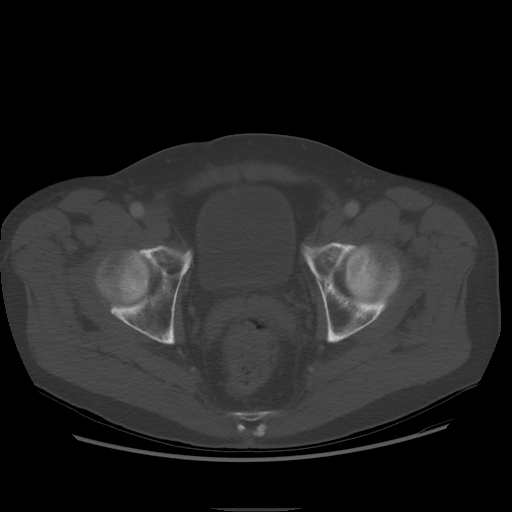
[im 22/88  soft-tissue]
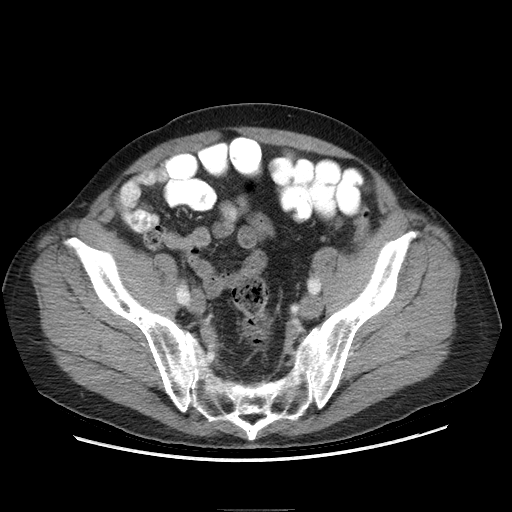
[im 33/88  soft-tissue]
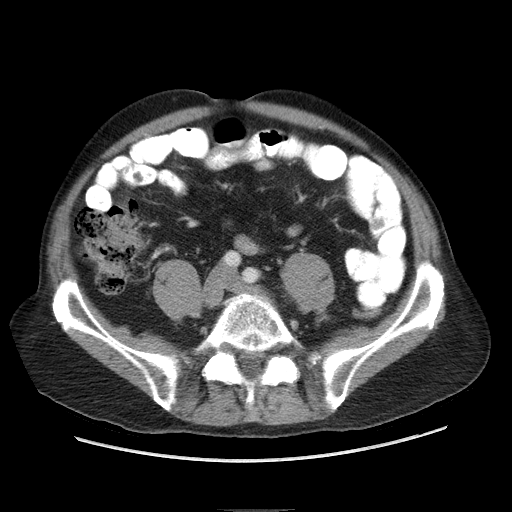
[im 44/88  soft-tissue]
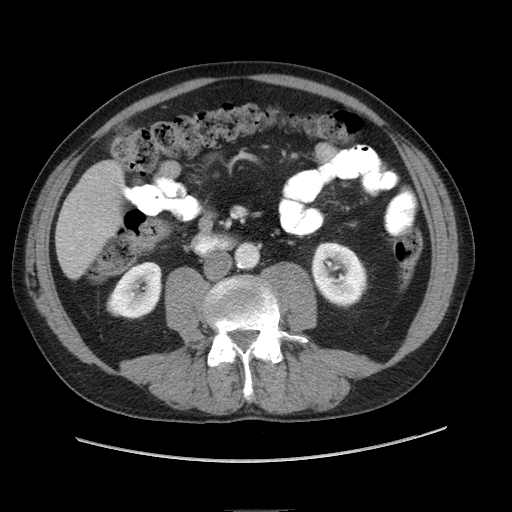
[im 44/88  lung]
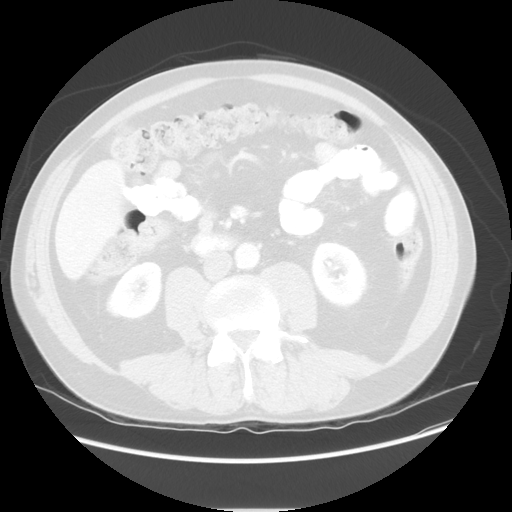
[im 55/88  soft-tissue]
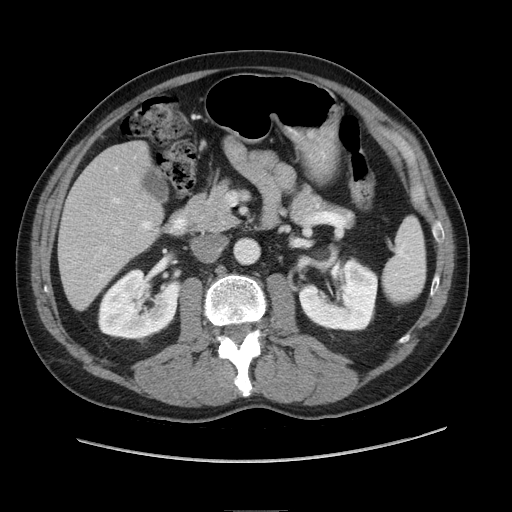
[im 55/88  lung]
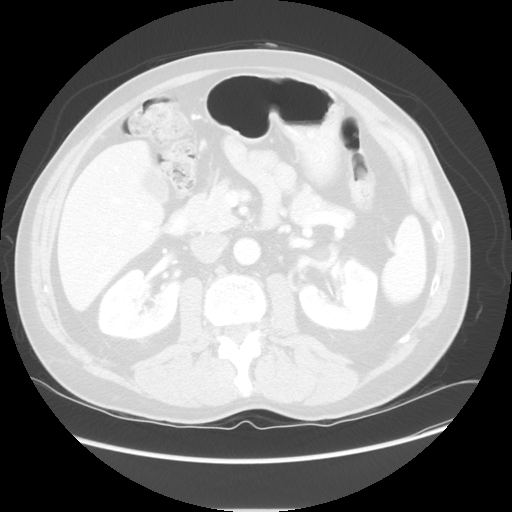
[im 66/88  soft-tissue]
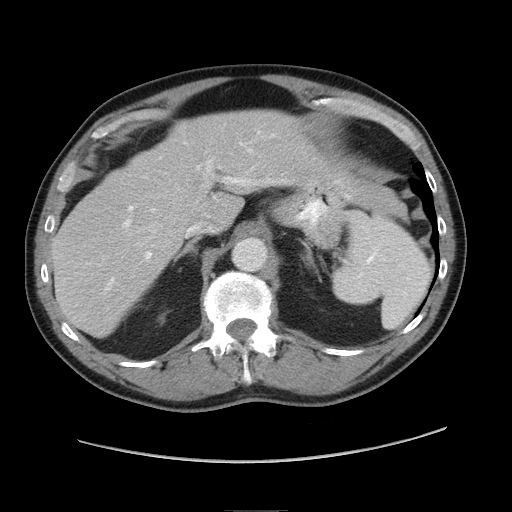
[im 66/88  lung]
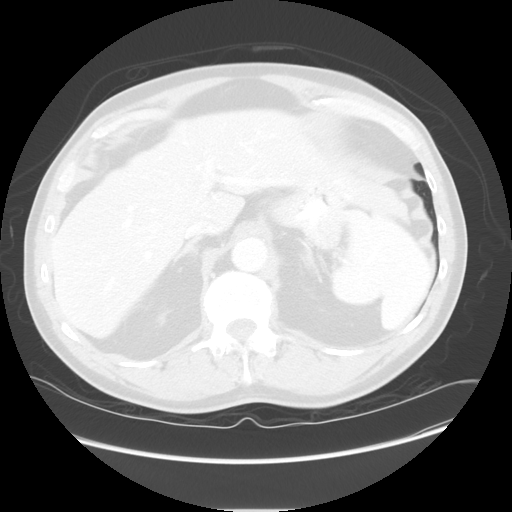
[im 77/88  soft-tissue]
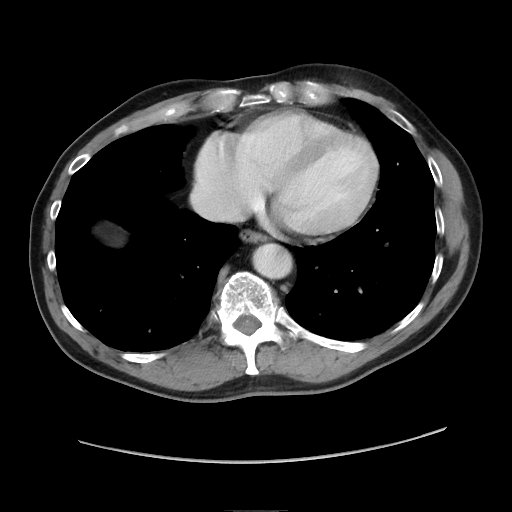
[im 77/88  lung]
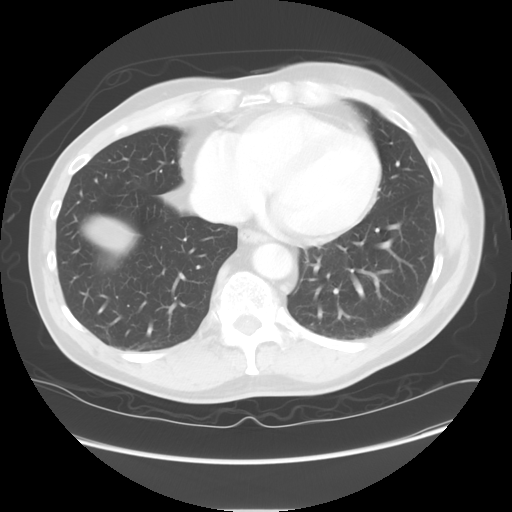

[Series 601: coronal body · coronal · 0.94mm/px · 1 of 132 slices shown, 2 images]
[im 44/132  soft-tissue]
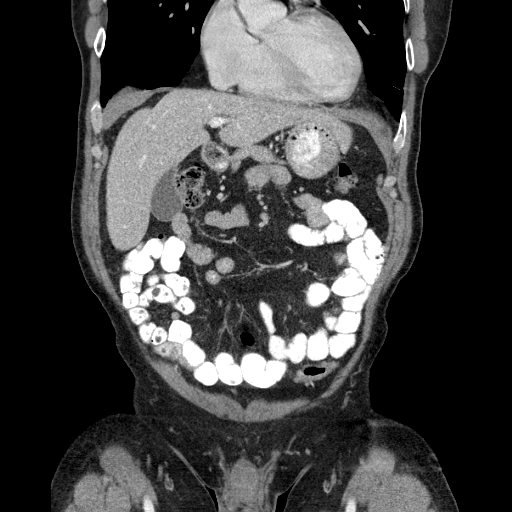
[im 44/132  bone]
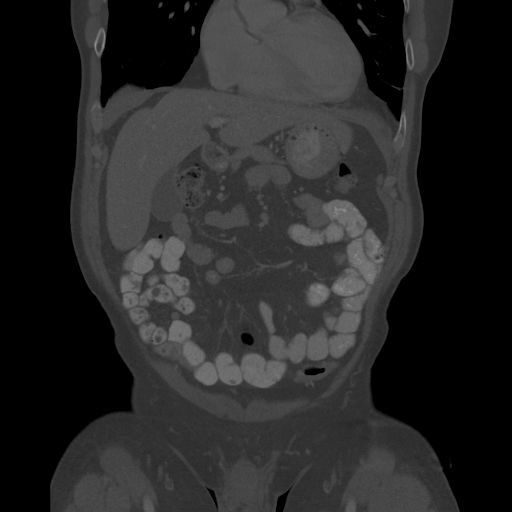

[Series 602: sagittal body · sagittal · 0.94mm/px · 5 of 164 slices shown]
[im 11/164  soft-tissue]
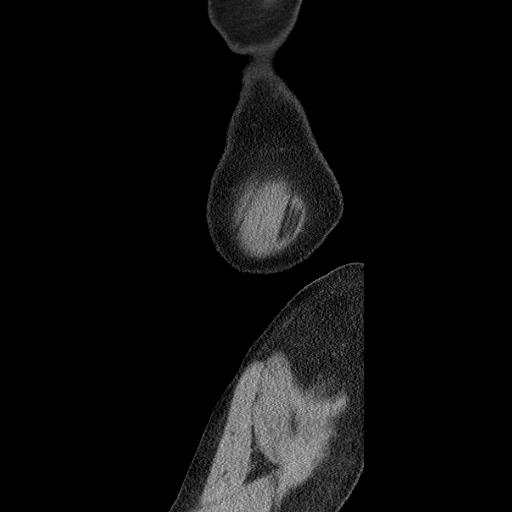
[im 31/164  soft-tissue]
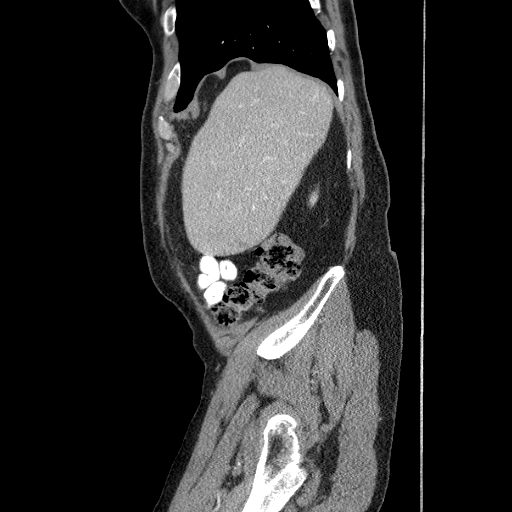
[im 51/164  soft-tissue]
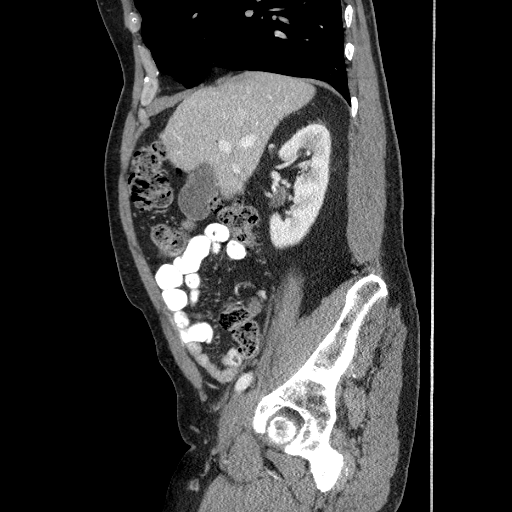
[im 72/164  soft-tissue]
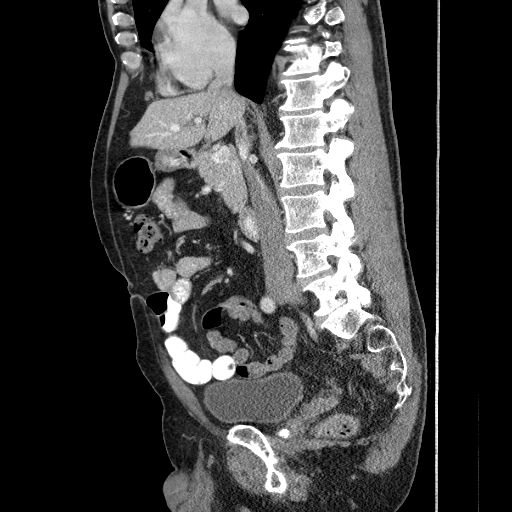
[im 92/164  soft-tissue]
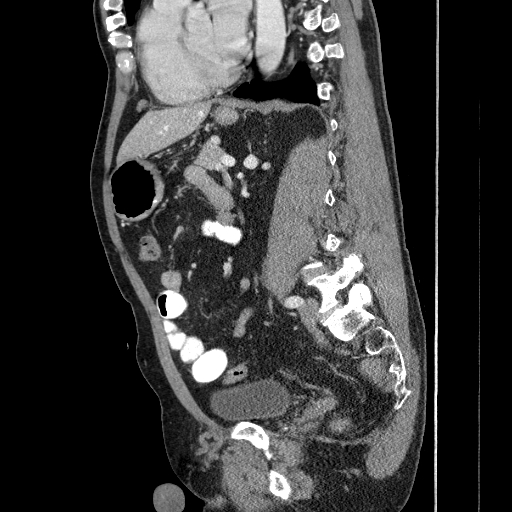

[13 of 36 positions shown; findings below may reference images not displayed]

FINDINGS: Lower chest: Lung bases clear

Hepatobiliary: Liver and gallbladder normal appearance

Pancreas: Normal appearance

Spleen: Normal appearance

Adrenals/Urinary Tract: Adrenal glands, kidneys, ureters, and
bladder normal appearance.

Stomach/Bowel: Stomach normal appearance. Appendix surgically absent
by history. Minimally prominent stool in rectum. Large and small
bowel loops otherwise normal appearance

Vascular/Lymphatic: Atherosclerotic calcifications aorta. Aorta
normal caliber. Vascular structures otherwise unremarkable. No
adenopathy.

Reproductive: Unremarkable prostate gland and seminal vesicles

Other: No free air or free fluid. No hernia or inflammatory process.

Musculoskeletal: Mild osseous demineralization without focal
abnormality.
IMPRESSION: No acute intra-abdominal or intrapelvic abnormalities.

Aortic Atherosclerosis ([id number]).

## 2019-12-15 DIAGNOSIS — M4722 Other spondylosis with radiculopathy, cervical region: Secondary | ICD-10-CM | POA: Diagnosis not present

## 2019-12-15 DIAGNOSIS — M542 Cervicalgia: Secondary | ICD-10-CM | POA: Diagnosis not present

## 2019-12-15 DIAGNOSIS — M5442 Lumbago with sciatica, left side: Secondary | ICD-10-CM | POA: Diagnosis not present

## 2019-12-30 DIAGNOSIS — M542 Cervicalgia: Secondary | ICD-10-CM | POA: Diagnosis not present

## 2020-01-10 DIAGNOSIS — M4692 Unspecified inflammatory spondylopathy, cervical region: Secondary | ICD-10-CM | POA: Diagnosis not present

## 2020-01-24 DIAGNOSIS — M47812 Spondylosis without myelopathy or radiculopathy, cervical region: Secondary | ICD-10-CM | POA: Diagnosis not present

## 2020-02-14 DIAGNOSIS — M47812 Spondylosis without myelopathy or radiculopathy, cervical region: Secondary | ICD-10-CM | POA: Diagnosis not present

## 2020-03-13 DIAGNOSIS — M47812 Spondylosis without myelopathy or radiculopathy, cervical region: Secondary | ICD-10-CM | POA: Diagnosis not present

## 2020-04-25 DIAGNOSIS — D2271 Melanocytic nevi of right lower limb, including hip: Secondary | ICD-10-CM | POA: Diagnosis not present

## 2020-04-25 DIAGNOSIS — L720 Epidermal cyst: Secondary | ICD-10-CM | POA: Diagnosis not present

## 2020-04-25 DIAGNOSIS — L918 Other hypertrophic disorders of the skin: Secondary | ICD-10-CM | POA: Diagnosis not present

## 2020-04-25 DIAGNOSIS — L821 Other seborrheic keratosis: Secondary | ICD-10-CM | POA: Diagnosis not present

## 2020-05-01 DIAGNOSIS — R7989 Other specified abnormal findings of blood chemistry: Secondary | ICD-10-CM | POA: Diagnosis not present

## 2020-05-01 DIAGNOSIS — Z125 Encounter for screening for malignant neoplasm of prostate: Secondary | ICD-10-CM | POA: Diagnosis not present

## 2020-05-01 DIAGNOSIS — Z0000 Encounter for general adult medical examination without abnormal findings: Secondary | ICD-10-CM | POA: Diagnosis not present

## 2020-05-04 DIAGNOSIS — Z1152 Encounter for screening for COVID-19: Secondary | ICD-10-CM | POA: Diagnosis not present

## 2020-05-04 DIAGNOSIS — U071 COVID-19: Secondary | ICD-10-CM | POA: Diagnosis not present

## 2020-05-05 ENCOUNTER — Ambulatory Visit (HOSPITAL_COMMUNITY)
Admission: RE | Admit: 2020-05-05 | Discharge: 2020-05-05 | Disposition: A | Discharge status: Home / Self Care | Payer: BC Managed Care – PPO | Source: Ambulatory Visit | Attending: Pulmonary Disease | Admitting: Pulmonary Disease

## 2020-05-05 ENCOUNTER — Other Ambulatory Visit (HOSPITAL_COMMUNITY): Payer: Self-pay | Admitting: Adult Health

## 2020-05-05 ENCOUNTER — Other Ambulatory Visit (HOSPITAL_COMMUNITY): Payer: Self-pay

## 2020-05-05 DIAGNOSIS — U071 COVID-19: Secondary | ICD-10-CM | POA: Diagnosis not present

## 2020-05-05 MED ORDER — SODIUM CHLORIDE 0.9 % IV SOLN
1200.0000 mg | Freq: Once | INTRAVENOUS | Status: AC
  Administered 2020-05-05: 1200 mg via INTRAVENOUS

## 2020-05-05 MED ORDER — ALBUTEROL SULFATE HFA 108 (90 BASE) MCG/ACT IN AERS: 2.0000 | INHALATION_SPRAY | Freq: Once | RESPIRATORY_TRACT | Status: DC | PRN

## 2020-05-05 MED ORDER — METHYLPREDNISOLONE SODIUM SUCC 125 MG IJ SOLR: 125.0000 mg | Freq: Once | INTRAMUSCULAR | Status: DC | PRN

## 2020-05-05 MED ORDER — SODIUM CHLORIDE 0.9 % IV SOLN: INTRAVENOUS | Status: DC | PRN

## 2020-05-05 MED ORDER — FAMOTIDINE IN NACL 20-0.9 MG/50ML-% IV SOLN: 20.0000 mg | Freq: Once | INTRAVENOUS | Status: DC | PRN

## 2020-05-05 MED ORDER — DIPHENHYDRAMINE HCL 50 MG/ML IJ SOLN: 50.0000 mg | Freq: Once | INTRAMUSCULAR | Status: DC | PRN

## 2020-05-05 MED ORDER — EPINEPHRINE 0.3 MG/0.3ML IJ SOAJ: 0.3000 mg | Freq: Once | INTRAMUSCULAR | Status: DC | PRN

## 2020-05-05 NOTE — Progress Notes (Signed)
I connected by phone with Micheal May on 05/05/2020 at 6:18 PM to discuss the potential use of a new treatment for mild to moderate COVID-19 viral infection in non-hospitalized patients.  This patient is a 64 y.o. male that meets the FDA criteria for Emergency Use Authorization of COVID monoclonal antibody casirivimab/imdevimab or bamlanivimab/eteseviamb.  Has a (+) direct SARS-CoV-2 viral test result  Has mild or moderate COVID-19   Is NOT hospitalized due to COVID-19  Is within 10 days of symptom onset  Has at least one of the high risk factor(s) for progression to severe COVID-19 and/or hospitalization as defined in EUA.  Specific high risk criteria : BMI > 25   I have spoken and communicated the following to the patient or parent/caregiver regarding COVID monoclonal antibody treatment:  1. FDA has authorized the emergency use for the treatment of mild to moderate COVID-19 in adults and pediatric patients with positive results of direct SARS-CoV-2 viral testing who are 43 years of age and older weighing at least 40 kg, and who are at high risk for progressing to severe COVID-19 and/or hospitalization.  2. The significant known and potential risks and benefits of COVID monoclonal antibody, and the extent to which such potential risks and benefits are unknown.  3. Information on available alternative treatments and the risks and benefits of those alternatives, including clinical trials.  4. Patients treated with COVID monoclonal antibody should continue to self-isolate and use infection control measures (e.g., wear mask, isolate, social distance, avoid sharing personal items, clean and disinfect "high touch" surfaces, and frequent handwashing) according to CDC guidelines.   5. The patient or parent/caregiver has the option to accept or refuse COVID monoclonal antibody treatment.  After reviewing this information with the patient, the patient has agreed to receive one of the available  covid 19 monoclonal antibodies and will be provided an appropriate fact sheet prior to infusion. Scot Dock, NP 05/05/2020 6:18 PM

## 2020-05-05 NOTE — Discharge Instructions (Signed)

## 2020-05-05 NOTE — Progress Notes (Signed)
  Diagnosis: COVID-19  Physician: Patrick Wright, MD  Procedure: [facility] Infusion [facility] Med: casirivimab\imdevimab infusion - Provided patient with casirivimab\imdevimab fact sheet for patients, parents and caregivers prior to infusion.  Complications: No immediate complications noted.  Discharge: Discharged home   Adean Milosevic N Ata Pecha 05/05/2020  

## 2020-05-29 DIAGNOSIS — K219 Gastro-esophageal reflux disease without esophagitis: Secondary | ICD-10-CM | POA: Diagnosis not present

## 2020-05-29 DIAGNOSIS — Z0000 Encounter for general adult medical examination without abnormal findings: Secondary | ICD-10-CM | POA: Diagnosis not present

## 2020-06-06 DIAGNOSIS — H43813 Vitreous degeneration, bilateral: Secondary | ICD-10-CM | POA: Diagnosis not present

## 2020-06-06 DIAGNOSIS — H3550 Unspecified hereditary retinal dystrophy: Secondary | ICD-10-CM | POA: Diagnosis not present

## 2020-06-06 DIAGNOSIS — H353132 Nonexudative age-related macular degeneration, bilateral, intermediate dry stage: Secondary | ICD-10-CM | POA: Diagnosis not present

## 2020-07-18 DIAGNOSIS — M47812 Spondylosis without myelopathy or radiculopathy, cervical region: Secondary | ICD-10-CM | POA: Diagnosis not present

## 2020-07-26 ENCOUNTER — Encounter: Payer: Self-pay | Admitting: Family Medicine

## 2020-07-26 DIAGNOSIS — Z1212 Encounter for screening for malignant neoplasm of rectum: Secondary | ICD-10-CM | POA: Diagnosis not present

## 2020-07-26 LAB — IFOBT (OCCULT BLOOD): IFOBT: NEGATIVE

## 2020-08-08 DIAGNOSIS — Z1159 Encounter for screening for other viral diseases: Secondary | ICD-10-CM | POA: Diagnosis not present

## 2020-09-05 DIAGNOSIS — Z0289 Encounter for other administrative examinations: Secondary | ICD-10-CM | POA: Diagnosis not present

## 2020-11-06 DIAGNOSIS — R197 Diarrhea, unspecified: Secondary | ICD-10-CM | POA: Diagnosis not present

## 2020-12-25 ENCOUNTER — Encounter: Payer: Self-pay | Admitting: Gastroenterology

## 2021-01-16 ENCOUNTER — Ambulatory Visit: Payer: BC Managed Care – PPO | Admitting: Gastroenterology

## 2021-01-16 ENCOUNTER — Encounter: Payer: Self-pay | Admitting: Gastroenterology

## 2021-01-16 VITALS — BP 158/92 | HR 76 | Ht 72.0 in | Wt 208.5 lb

## 2021-01-16 DIAGNOSIS — R109 Unspecified abdominal pain: Secondary | ICD-10-CM | POA: Diagnosis not present

## 2021-01-16 DIAGNOSIS — R140 Abdominal distension (gaseous): Secondary | ICD-10-CM | POA: Diagnosis not present

## 2021-01-16 DIAGNOSIS — K589 Irritable bowel syndrome without diarrhea: Secondary | ICD-10-CM | POA: Insufficient documentation

## 2021-01-16 DIAGNOSIS — R194 Change in bowel habit: Secondary | ICD-10-CM | POA: Diagnosis not present

## 2021-01-16 MED ORDER — PLENVU 140 G PO SOLR: ORAL | 0 refills | Status: DC

## 2021-01-16 NOTE — Patient Instructions (Signed)
If you are age 63 or older, your body mass index should be between 23-30. Your Body mass index is 28.28 kg/m. If this is out of the aforementioned range listed, please consider follow up with your Primary Care Provider.  If you are age 84 or younger, your body mass index should be between 19-25. Your Body mass index is 28.28 kg/m. If this is out of the aformentioned range listed, please consider follow up with your Primary Care Provider.   You have been scheduled for a colonoscopy. Please follow written instructions given to you at your visit today.  Please pick up your prep supplies at the pharmacy within the next 1-3 days. If you use inhalers (even only as needed), please bring them with you on the day of your procedure.  Due to recent changes in healthcare laws, you may see the results of your imaging and laboratory studies on MyChart before your provider has had a chance to review them.  We understand that in some cases there may be results that are confusing or concerning to you. Not all laboratory results come back in the same time frame and the provider may be waiting for multiple results in order to interpret others.  Please give [place] 48 hours in order for your provider to thoroughly review all the results before contacting the office for clarification of your results.   The [facility] providers would like to encourage you to use [facility] to communicate with providers for non-urgent requests or questions.  Due to long hold times on the telephone, sending your provider a message by [facility] may be a faster and more efficient way to get a response.  Please allow 48 business hours for a response.  Please remember that this is for non-urgent requests.   It was a pleasure to see you today!  Thank you for trusting me with your gastrointestinal care!    Alonza Bogus, PA-C

## 2021-01-16 NOTE — Progress Notes (Signed)
Reviewed and agree with management plan.  Fusae Florio T. Lawrencia Mauney, MD FACG [phone]  

## 2021-01-16 NOTE — Progress Notes (Signed)
01/16/2021 Micheal May [id number] [birth year]   HISTORY OF PRESENT ILLNESS: This is a 65 year old male who is a patient of Dr. Celesta Aver, known to him only for visit in April 2019.  He is thought to have IBS and GERD/dyspepsia.  He is here today with complaints of left-sided abdominal pain, just to the left of and below his umbilicus.  The pain has been there since about last August.  He says it is not really pain per se, but rather a constant ache and if you push on the area then it is certainly a little bit sore.  He says the pain is always in the same spot and just stays in that area.  He also gets very bloated every time that he eats, even if it is just a cracker.  He says it only gets very full and bloated in that area, not all over his abdomen.  He has also noticed a change in his bowel habits over the past several months and says that his stools are very loose and more like pieces of stool about twice a day.  He denies seeing any blood in his stools.  His last colonoscopy was reportedly in 2013, but we do not have records.  He does take a daily probiotic.  Patient was referred here by his PCP, Dr. Ardeth Perfect, for loose stool, IBS, gas.   Past Medical History:  Diagnosis Date  . Anxiety   . Arthritis    OA  . GERD (gastroesophageal reflux disease)   . Grover's disease   . IBS (irritable bowel syndrome)   . MRSA (methicillin resistant staph aureus) culture positive    hx of due to ingrown hair    10  yrs ago  . PONV (postoperative nausea and vomiting)    Past Surgical History:  Procedure Laterality Date  . APPENDECTOMY    . COLONOSCOPY  2013   Negative screening exam at [facility]  . KNEE SURGERY Bilateral   . TOTAL KNEE ARTHROPLASTY Right 02/06/2016   Procedure: TOTAL KNEE ARTHROPLASTY;  Surgeon: Dorna Leitz, MD;  Location: [place];  Service: Orthopedics;  Laterality: Right;    reports that he has never smoked. He has never used smokeless tobacco. He reports current alcohol use. He  reports that he does not use drugs. family history includes Hypertension in his mother; Irritable bowel syndrome in his mother. No Known Allergies    Outpatient Encounter Medications as of 01/16/2021  Medication Sig  . ascorbic acid (VITAMIN C) 1000 MG tablet Take 2,000 mg by mouth daily.  . Cholecalciferol (VITAMIN D3) 50 MCG (2000 UT) TABS Take 1 tablet by mouth daily.  Marland Kitchen escitalopram (LEXAPRO) 20 MG tablet Take 20 mg by mouth daily.  . eszopiclone (LUNESTA) 1 MG TABS tablet Take 2 mg by mouth at bedtime as needed for sleep. Take immediately before bedtime  . Multiple Vitamins-Minerals (ICAPS AREDS 2 PO) Take 2 capsules by mouth daily.  . Multiple Vitamins-Minerals (MULTIVITAMIN WITH MINERALS) tablet Take 1 tablet by mouth daily.  Marland Kitchen omeprazole (PRILOSEC) 20 MG capsule Take 20 mg by mouth daily.  . Probiotic Product (PROBIOTIC DAILY PO) Take 1 tablet by mouth daily.  . valACYclovir (VALTREX) 500 MG tablet Take 500 mg by mouth daily.   . [DISCONTINUED] Flaxseed, Linseed, (FLAXSEED OIL MAX STR) 1300 MG CAPS Take 1 capsule by mouth daily. (Patient not taking: Reported on 01/16/2021)  . [DISCONTINUED] meloxicam (MOBIC) 15 MG tablet Take 1 tablet by mouth as needed. (Patient not  taking: Reported on 01/16/2021)   No facility-administered encounter medications on file as of 01/16/2021.     REVIEW OF SYSTEMS  : All other systems reviewed and negative except where noted in the History of Present Illness.   PHYSICAL EXAM: BP (!) 158/92 (BP Location: Left Arm, Patient Position: Sitting, Cuff Size: Normal)   Pulse 76   Ht 6' (1.829 m)   Wt 208 lb 8 oz (94.6 kg)   SpO2 97%   BMI 28.28 kg/m  General: Well developed white male in no acute distress Head: Normocephalic and atraumatic Eyes:  Sclerae anicteric, conjunctivea pink. Ears: Normal auditory acuity Lungs: Clear throughout to auscultation; no W/R/R. Heart: Regular rate and rhythm; no M/R/G. Abdomen: Soft, non-distended.  BS present.  Mild TTP  just to the left and below his umbilicus. Rectal:  Will be done at the time of colonoscopy. Musculoskeletal: Symmetrical with no gross deformities  Skin: No lesions on visible extremities Extremities: No edema Neurological: Alert oriented x 4, grossly non-focal Psychological:  Alert and cooperative. Normal mood and affect  ASSESSMENT AND PLAN: *65 year old male with history of IBS but now complaining of localized left sided abdominal pain and bloating in that area as well as change in bowel habits with ongoing loose stools for the past several months.  Last colonoscopy was 2013 according to his report.  The part that I do like about the story is that the symptoms always stay in the same spot rather than more generalized.  We will proceed with colonoscopy with Dr. Fuller Plan since he has sooner availability than Dr. Carlean Purl.  If colonoscopy is unremarkable and symptoms continue then may want to consider CT scan of the abdomen and pelvis.  Other things that we discussed were possible testing or treatment for SIBO, possible celiac labs, etc.   CC:  Velna Hatchet, MD

## 2021-01-23 ENCOUNTER — Encounter: Payer: Self-pay | Admitting: Gastroenterology

## 2021-01-28 ENCOUNTER — Encounter: Payer: Self-pay | Admitting: Gastroenterology

## 2021-01-28 ENCOUNTER — Other Ambulatory Visit: Payer: Self-pay | Admitting: *Deleted

## 2021-01-28 ENCOUNTER — Ambulatory Visit (AMBULATORY_SURGERY_CENTER): Payer: BC Managed Care – PPO | Admitting: Gastroenterology

## 2021-01-28 ENCOUNTER — Other Ambulatory Visit: Payer: Self-pay

## 2021-01-28 ENCOUNTER — Other Ambulatory Visit (INDEPENDENT_AMBULATORY_CARE_PROVIDER_SITE_OTHER): Payer: BC Managed Care – PPO

## 2021-01-28 VITALS — BP 119/80 | HR 65 | Temp 98.0°F | Resp 10 | Ht 72.0 in | Wt 208.0 lb

## 2021-01-28 DIAGNOSIS — D124 Benign neoplasm of descending colon: Secondary | ICD-10-CM | POA: Diagnosis not present

## 2021-01-28 DIAGNOSIS — D125 Benign neoplasm of sigmoid colon: Secondary | ICD-10-CM

## 2021-01-28 DIAGNOSIS — R140 Abdominal distension (gaseous): Secondary | ICD-10-CM

## 2021-01-28 DIAGNOSIS — R194 Change in bowel habit: Secondary | ICD-10-CM

## 2021-01-28 DIAGNOSIS — R1030 Lower abdominal pain, unspecified: Secondary | ICD-10-CM | POA: Diagnosis not present

## 2021-01-28 DIAGNOSIS — D369 Benign neoplasm, unspecified site: Secondary | ICD-10-CM

## 2021-01-28 DIAGNOSIS — R109 Unspecified abdominal pain: Secondary | ICD-10-CM

## 2021-01-28 HISTORY — PX: COLONOSCOPY W/ BIOPSIES: SHX1374

## 2021-01-28 HISTORY — DX: Benign neoplasm, unspecified site: D36.9

## 2021-01-28 LAB — BUN: BUN: 12 mg/dL (ref 6–23)

## 2021-01-28 LAB — CREATININE, SERUM: Creatinine, Ser: 0.91 mg/dL (ref 0.40–1.50)

## 2021-01-28 MED ORDER — GLYCOPYRROLATE 2 MG PO TABS: 2.0000 mg | ORAL_TABLET | Freq: Two times a day (BID) | ORAL | 3 refills | Status: DC

## 2021-01-28 MED ORDER — SODIUM CHLORIDE 0.9 % IV SOLN: 500.0000 mL | Freq: Once | INTRAVENOUS | Status: DC

## 2021-01-28 NOTE — Progress Notes (Signed)
VS taken by [place] 

## 2021-01-28 NOTE — Progress Notes (Signed)
A/ox3, pleased with MAC, report to RN 

## 2021-01-28 NOTE — Patient Instructions (Signed)
Impression/Recommendations:  Polyp and hemorrhoid handouts given to patient.  Resume previous diet. Continue present medications. Await pathology results.  Robinul 2 mg by mouth 2 times daily.  CT of abdomen with contrast and pelvis with contrast to be scheduled.  Return to GI office in 6 weeks.  Repeat colonoscopy for surveillance.  Date to be determined after pathology results reviewed.  YOU HAD AN ENDOSCOPIC PROCEDURE TODAY AT [facility]:   Refer to the procedure report that was given to you for any specific questions about what was found during the examination.  If the procedure report does not answer your questions, please call your gastroenterologist to clarify.  If you requested that your care partner not be given the details of your procedure findings, then the procedure report has been included in a sealed envelope for you to review at your convenience later.  YOU SHOULD EXPECT: Some feelings of bloating in the abdomen. Passage of more gas than usual.  Walking can help get rid of the air that was put into your GI tract during the procedure and reduce the bloating. If you had a lower endoscopy (such as a colonoscopy or flexible sigmoidoscopy) you may notice spotting of blood in your stool or on the toilet paper. If you underwent a bowel prep for your procedure, you may not have a normal bowel movement for a few days.  Please Note:  You might notice some irritation and congestion in your nose or some drainage.  This is from the oxygen used during your procedure.  There is no need for concern and it should clear up in a day or so.  SYMPTOMS TO REPORT IMMEDIATELY:  Following lower endoscopy (colonoscopy or flexible sigmoidoscopy):  Excessive amounts of blood in the stool  Significant tenderness or worsening of abdominal pains  Swelling of the abdomen that is new, acute  Fever of 100F or higher For urgent or emergent issues, a gastroenterologist can be reached at  any hour by calling [phone]. Do not use MyChart messaging for urgent concerns.    DIET:  We do recommend a small meal at first, but then you may proceed to your regular diet.  Drink plenty of fluids but you should avoid alcoholic beverages for 24 hours.  ACTIVITY:  You should plan to take it easy for the rest of today and you should NOT DRIVE or use heavy machinery until tomorrow (because of the sedation medicines used during the test).    FOLLOW UP: Our staff will call the number listed on your records 48-72 hours following your procedure to check on you and address any questions or concerns that you may have regarding the information given to you following your procedure. If we do not reach you, we will leave a message.  We will attempt to reach you two times.  During this call, we will ask if you have developed any symptoms of COVID 19. If you develop any symptoms (ie: fever, flu-like symptoms, shortness of breath, cough etc.) before then, please call [phone].  If you test positive for Covid 19 in the 2 weeks post procedure, please call and report this information to [place].    If any biopsies were taken you will be contacted by phone or by letter within the next 1-3 weeks.  Please call us at [phone] if you have not heard about the biopsies in 3 weeks.    SIGNATURES/CONFIDENTIALITY: You and/or your care partner have signed paperwork which will be entered into  your electronic medical record.  These signatures attest to the fact that that the information above on your After Visit Summary has been reviewed and is understood.  Full responsibility of the confidentiality of this discharge information lies with you and/or your care-partner.

## 2021-01-28 NOTE — Op Note (Signed)
[name] Patient Name: Micheal May Procedure Date: 01/28/2021 2:10 PM MRN: [id number] Endoscopist: Ladene Artist , MD Age: 65 Referring MD:  Date of Birth: 1956/02/07 Gender: Male Account #: [id number] Procedure:                Colonoscopy Indications:              Abdominal pain in the left side, Change in bowel ,                            Abdominal bloating Medicines:                Monitored Anesthesia Care Procedure:                Pre-Anesthesia Assessment:                           - Prior to the procedure, a History and Physical                            was performed, and patient medications and                            allergies were reviewed. The patient's tolerance of                            previous anesthesia was also reviewed. The risks                            and benefits of the procedure and the sedation                            options and risks were discussed with the patient.                            All questions were answered, and informed consent                            was obtained. Prior Anticoagulants: The patient has                            taken no previous anticoagulant or antiplatelet                            agents. ASA Grade Assessment: II - A patient with                            mild systemic disease. After reviewing the risks                            and benefits, the patient was deemed in                            satisfactory condition to undergo the procedure.  After obtaining informed consent, the colonoscope                            was passed under direct vision. Throughout the                            procedure, the patient's blood pressure, pulse, and                            oxygen saturations were monitored continuously. The                            Olympus CF-HQ190L [id number]) Colonoscope was                            introduced through the anus and advanced to  the the                            terminal ileum, with identification of the                            appendiceal orifice and IC valve. The ileocecal                            valve, appendiceal orifice, and rectum were                            photographed. The quality of the bowel preparation                            was good. The colonoscopy was performed without                            difficulty. The patient tolerated the procedure                            well. Scope In: 2:18:18 PM Scope Out: 2:37:20 PM Scope Withdrawal Time: 0 hours 15 minutes 16 seconds  Total Procedure Duration: 0 hours 19 minutes 2 seconds  Findings:                 The perianal and digital rectal examinations were                            normal.                           The terminal ileum appeared normal.                           A 3 mm polyp was found in the descending colon. The                            polyp was sessile. The polyp was removed with a  cold biopsy forceps. Resection and retrieval were                            complete.                           Two sessile polyps were found in the sigmoid colon                            and descending colon. The polyps were 7 mm in size.                            These polyps were removed with a cold snare.                            Resection and retrieval were complete.                           Internal hemorrhoids were found during                            retroflexion. The hemorrhoids were moderate and                            Grade I (internal hemorrhoids that do not prolapse).                           The exam was otherwise without abnormality on                            direct and retroflexion views. Complications:            No immediate complications. Estimated blood loss:                            None. Estimated Blood Loss:     Estimated blood loss: none. Impression:               - The  examined portion of the ileum was normal.                           - One 3 mm polyp in the descending colon, removed                            with a cold biopsy forceps. Resected and retrieved.                           - Two 7 mm polyps in the sigmoid colon and in the                            descending colon, removed with a cold snare.                            Resected and retrieved.                           -  Internal hemorrhoids.                           - The examination was otherwise normal on direct                            and retroflexion views. Recommendation:           - Repeat colonoscopy date to be determined after                            pending pathology results are reviewed for                            surveillance based on pathology results.                           - Patient has a contact number available for                            emergencies. The signs and symptoms of potential                            delayed complications were discussed with the                            patient. Return to normal activities tomorrow.                            Written discharge instructions were provided to the                            patient.                           - Resume previous diet.                           - Continue present medications.                           - Await pathology results.                           - CT scan (computed tomography) of abdomen with                            contrast and pelvis with contrast to be scheduled.                           - Robinul 2 mg PO BID, 1 year of refills.                           - Return to GI office in 6 weeks. Ladene Artist, MD 01/28/2021 2:43:25 PM This report has been signed electronically.

## 2021-01-28 NOTE — Progress Notes (Signed)
Called to room to assist during endoscopic procedure.  Patient ID and intended procedure confirmed with present staff. Received instructions for my participation in the procedure from the performing physician.  

## 2021-01-29 ENCOUNTER — Telehealth: Payer: Self-pay

## 2021-01-29 DIAGNOSIS — R109 Unspecified abdominal pain: Secondary | ICD-10-CM

## 2021-01-29 DIAGNOSIS — R194 Change in bowel habit: Secondary | ICD-10-CM

## 2021-01-29 DIAGNOSIS — R140 Abdominal distension (gaseous): Secondary | ICD-10-CM

## 2021-01-29 NOTE — Telephone Encounter (Signed)
Per 01/28/21 procedure note: - CT scan of the abdomen and pelvis with contrast - Return to GI office in 6 weeks  6 week follow up recall in epic.   CT scan order in epic. Spoke with [facility], they have received the order and will contact patient directly to schedule CT appt. Patient is aware and verbalized understanding and had no concerns at the end of the call.

## 2021-01-30 ENCOUNTER — Telehealth: Payer: Self-pay

## 2021-01-30 NOTE — Telephone Encounter (Signed)
  Follow up Call-  Call back number 01/28/2021  Post procedure Call Back phone  # [phone]  Permission to leave phone message Yes  Some recent data might be hidden     Patient questions:  Do you have a fever, pain , or abdominal swelling? No. Pain Score  0 *  Have you tolerated food without any problems? Yes.    Have you been able to return to your normal activities? Yes.    Do you have any questions about your discharge instructions: Diet   No. Medications  No. Follow up visit  No.  Do you have questions or concerns about your Care? No.  Actions: * If pain score is 4 or above: No action needed, pain <4.   Have you developed a fever since your procedure? No   2.   Have you had an respiratory symptoms (SOB or cough) since your procedure? No   3.   Have you tested positive for COVID 19 since your procedure no   4.   Have you had any family members/close contacts diagnosed with the COVID 19 since your procedure?  No    If yes to any of these questions please route to Joylene John, RN and Joella Prince, RN

## 2021-02-11 ENCOUNTER — Encounter: Payer: Self-pay | Admitting: Gastroenterology

## 2021-02-18 ENCOUNTER — Ambulatory Visit
Admission: RE | Admit: 2021-02-18 | Discharge: 2021-02-18 | Disposition: A | Discharge status: Home / Self Care | Payer: BC Managed Care – PPO | Source: Ambulatory Visit | Attending: Gastroenterology | Admitting: Gastroenterology

## 2021-02-18 DIAGNOSIS — I700 Atherosclerosis of aorta: Secondary | ICD-10-CM | POA: Diagnosis not present

## 2021-02-18 DIAGNOSIS — R140 Abdominal distension (gaseous): Secondary | ICD-10-CM

## 2021-02-18 DIAGNOSIS — D7389 Other diseases of spleen: Secondary | ICD-10-CM | POA: Diagnosis not present

## 2021-02-18 DIAGNOSIS — R194 Change in bowel habit: Secondary | ICD-10-CM

## 2021-02-18 DIAGNOSIS — Q433 Congenital malformations of intestinal fixation: Secondary | ICD-10-CM | POA: Diagnosis not present

## 2021-02-18 DIAGNOSIS — R109 Unspecified abdominal pain: Secondary | ICD-10-CM

## 2021-02-18 MED ORDER — IOPAMIDOL (ISOVUE-300) INJECTION 61%
100.0000 mL | Freq: Once | INTRAVENOUS | Status: AC | PRN
  Administered 2021-02-18: 100 mL via INTRAVENOUS

## 2021-02-20 ENCOUNTER — Telehealth: Payer: Self-pay

## 2021-02-20 NOTE — Telephone Encounter (Signed)
Left message for patient to please call back. Trying to give CT results.

## 2021-02-20 NOTE — Telephone Encounter (Signed)
Patient is returning your call.  

## 2021-02-20 NOTE — Telephone Encounter (Signed)
See CT result note

## 2021-02-20 NOTE — Telephone Encounter (Signed)
-----   Message from Ladene Artist, MD sent at 02/20/2021  8:37 AM EDT ----- Suspected small right kidney cyst and small hepatic cyst Multilevel degenerative disc disease Aortic atherosclerosis  Assess response to Robinul

## 2021-04-24 ENCOUNTER — Ambulatory Visit: Payer: BC Managed Care – PPO | Admitting: Internal Medicine

## 2021-04-24 ENCOUNTER — Other Ambulatory Visit: Payer: BC Managed Care – PPO

## 2021-04-24 ENCOUNTER — Encounter: Payer: Self-pay | Admitting: Internal Medicine

## 2021-04-24 VITALS — BP 140/88 | HR 76 | Ht 72.0 in | Wt 209.0 lb

## 2021-04-24 DIAGNOSIS — R140 Abdominal distension (gaseous): Secondary | ICD-10-CM

## 2021-04-24 DIAGNOSIS — R109 Unspecified abdominal pain: Secondary | ICD-10-CM

## 2021-04-24 DIAGNOSIS — R195 Other fecal abnormalities: Secondary | ICD-10-CM | POA: Diagnosis not present

## 2021-04-24 DIAGNOSIS — K219 Gastro-esophageal reflux disease without esophagitis: Secondary | ICD-10-CM

## 2021-04-24 NOTE — Progress Notes (Signed)
Micheal May 65 y.o. 04/07/1956 [id number]  Assessment & Plan:   Encounter Diagnoses  Name Primary?   Bloating Yes   Left sided abdominal pain    Loose stools    Gastroesophageal reflux disease, unspecified whether esophagitis present     Orders Placed This Encounter  Procedures   Tissue transglutaminase, IgA   IgA   If celiac testing is negative we will proceed with a lactulose hydrogen breath test and I will notify my medical assistant so she can forward the paperwork to the testing agency.  We did give him a kit today.  Consider a taper off PPI after we sort through this.  He is reassured today given the multitude of negative objective test so far i.e. no serious health problem likely.  We will try to help improve his quality of life.  I appreciate the opportunity to care for this patient. CC: Velna Hatchet, MD   Subjective:   Chief Complaint: Loose stools bloating and abdominal pain  HPI 65 year old white man with a history of dyspepsia and IBS problems he returned to [place] earlier this year and saw one of our physician assistants and underwent a colonoscopy by Dr. Fuller Plan outlined as below:  - The examined portion of the ileum was normal. - One 3 mm polyp in the descending colon, removed with a cold biopsy forceps. Resected and retrieved. - Two 7 mm polyps in the sigmoid colon and in the descending colon, removed with a cold snare. Resected and retrieved. - Internal hemorrhoids. - The examination was otherwise normal on direct and retroflexion views. CT abdomen and pelvis with contrast was ordered as below  IMPRESSION: 1. A specific cause for the patient's abdominal pain and bloating is not identified. 2. Other imaging findings of potential clinical significance: Small hypodense lesion in the caudate lobe of the liver is probably a cyst. Aortic Atherosclerosis (ICD10-I70.0). Degenerative disc disease.     Electronically Signed   By: Van Clines  M.D.   On: 02/19/2021 16:16  Trial of glycopyrrolate unhelpful   He reports a year history of a dull bloating sensation often and urgent loose stools.  No diet or medication changes and no triggers identified.  Bristol stool scale 5.  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  He was having some chest pain and was started on that and has been on it ever since.  He describes recurrent symptoms if he misses about 2 days.  Another historical feature he reports that his bowel movements are "very smelly". Wt Readings from Last 3 Encounters:  04/24/21 209 lb (94.8 kg)  01/28/21 208 lb (94.3 kg)  01/16/21 208 lb 8 oz (94.6 kg)    No Known Allergies Current Meds  Medication Sig   ascorbic acid (VITAMIN C) 1000 MG tablet Take 2,000 mg by mouth daily.   Cholecalciferol (VITAMIN D3) 50 MCG (2000 UT) TABS Take 1 tablet by mouth daily.   escitalopram (LEXAPRO) 20 MG tablet Take 20 mg by mouth daily.   eszopiclone (LUNESTA) 1 MG TABS tablet Take 2 mg by mouth at bedtime as needed for sleep. Take immediately before bedtime   Multiple Vitamins-Minerals (ICAPS AREDS 2 PO) Take 2 capsules by mouth daily.   Multiple Vitamins-Minerals (MULTIVITAMIN WITH MINERALS) tablet Take 1 tablet by mouth daily.   omeprazole (PRILOSEC) 20 MG capsule  Take 20 mg by mouth daily.   Probiotic Product (PROBIOTIC DAILY PO) Take 1 tablet by mouth daily.   valACYclovir (VALTREX) 500 MG tablet Take 500 mg by mouth daily.    Past Medical History:  Diagnosis Date   Anxiety    Arthritis    OA   GERD (gastroesophageal reflux disease)    Grover's disease    IBS (irritable bowel syndrome)    MRSA (methicillin resistant staph aureus) culture positive    hx of due to ingrown hair    10  yrs ago   PONV (postoperative nausea and vomiting)    Tubular  adenoma 01/28/2021   Past Surgical History:  Procedure Laterality Date   APPENDECTOMY     COLONOSCOPY  2013   Negative screening exam at [facility]  01/28/2021   tubular adenoma   KNEE SURGERY Bilateral    left shoulder repair Left 06/2018   TOTAL KNEE ARTHROPLASTY Right 02/06/2016   Procedure: TOTAL KNEE ARTHROPLASTY;  Surgeon: Dorna Leitz, MD;  Location: [facility];  Service: Orthopedics;  Laterality: Right;   Social History   Social History Narrative   The patient is the Health and safety inspector of [place]   Reports one child   Never smoker, weekly alcohol, no drug use   family history includes Hypertension in his mother; Irritable bowel syndrome in his mother.   Review of Systems As above  Objective:   Physical Exam BP 140/88   Pulse 76   Ht 6' (1.829 m)   Wt 209 lb (94.8 kg)   BMI 28.35 kg/m  NAD Abd soft NT - umbilicus soft protrusion no hernia

## 2021-04-24 NOTE — Patient Instructions (Addendum)
It was my pleasure to provide care to you today. Based on our discussion, I am providing you with my recommendations below:  RECOMMENDATION(S):   LABS:   Please proceed to the basement level for lab work before leaving today. Press "B" on the elevator. The lab is located at the first door on the left as you exit the elevator.  HEALTHCARE LAWS AND MY CHART RESULTS:   Due to recent changes in healthcare laws, you may see results of your imaging and/or laboratory studies on MyChart before I have had a chance to review them.  I understand that in some cases there may be results that are confusing or concerning to you. Please understand that not all results are received at same time and often I may need to interpret multiple results in order to provide you with the best plan of care or course of treatment. Therefore, I ask that you please give me 48 hours to thoroughly review all your results before contacting my office for clarification.   FOLLOW UP:  Follow up will be determine after we receive the results of your labs  BMI:  If you are age 5 or younger, your body mass index should be between 19-25. Your Body mass index is 28.35 kg/m. If this is out of the aformentioned range listed, please consider follow up with your Primary Care Provider.   MY CHART:  The [facility] providers would like to encourage you to use [facility] to communicate with providers for non-urgent requests or questions.  Due to long hold times on the telephone, sending your provider a message by [facility] may be a faster and more efficient way to get a response.  Please allow 48 business hours for a response.  Please remember that this is for non-urgent requests.   Thank you for trusting me with your gastrointestinal care!    Silvano Rusk, MD, [facility]

## 2021-04-25 LAB — TISSUE TRANSGLUTAMINASE, IGA: (tTG) Ab, IgA: <1 U/mL

## 2021-04-25 LAB — IGA: Immunoglobulin A: 107 mg/dL (ref 70–320)

## 2021-05-06 DIAGNOSIS — M542 Cervicalgia: Secondary | ICD-10-CM | POA: Diagnosis not present

## 2021-05-17 ENCOUNTER — Encounter: Payer: Self-pay | Admitting: Internal Medicine

## 2021-05-17 DIAGNOSIS — R140 Abdominal distension (gaseous): Secondary | ICD-10-CM | POA: Diagnosis not present

## 2021-06-05 DIAGNOSIS — N529 Male erectile dysfunction, unspecified: Secondary | ICD-10-CM | POA: Diagnosis not present

## 2021-06-05 DIAGNOSIS — H3550 Unspecified hereditary retinal dystrophy: Secondary | ICD-10-CM | POA: Diagnosis not present

## 2021-06-05 DIAGNOSIS — Z125 Encounter for screening for malignant neoplasm of prostate: Secondary | ICD-10-CM | POA: Diagnosis not present

## 2021-06-05 DIAGNOSIS — Z79899 Other long term (current) drug therapy: Secondary | ICD-10-CM | POA: Diagnosis not present

## 2021-06-05 DIAGNOSIS — H43813 Vitreous degeneration, bilateral: Secondary | ICD-10-CM | POA: Diagnosis not present

## 2021-06-05 DIAGNOSIS — H2513 Age-related nuclear cataract, bilateral: Secondary | ICD-10-CM | POA: Diagnosis not present

## 2021-06-05 DIAGNOSIS — H353132 Nonexudative age-related macular degeneration, bilateral, intermediate dry stage: Secondary | ICD-10-CM | POA: Diagnosis not present

## 2021-06-11 ENCOUNTER — Telehealth: Payer: Self-pay | Admitting: Internal Medicine

## 2021-06-11 DIAGNOSIS — K6389 Other specified diseases of intestine: Secondary | ICD-10-CM | POA: Insufficient documentation

## 2021-06-11 MED ORDER — RIFAXIMIN 550 MG PO TABS
550.0000 mg | ORAL_TABLET | Freq: Three times a day (TID) | ORAL | 0 refills | Status: AC
Start: 2021-06-11 — End: 2021-06-25

## 2021-06-11 NOTE — Telephone Encounter (Signed)
Inbound call from pt requesting a call back stating he never received his results from his SIBO test. Please advise. Thank you.

## 2021-06-11 NOTE — Telephone Encounter (Signed)
Okay  Let him know I sent in a prescription for Xifaxan 550 mg 3 times daily x14 days.  This is a treatment for diarrhea predominant irritable bowel and small intestinal bacterial overgrowth.  Have him let me know how things are going 2 to 4 weeks after the treatment.  Also warned him that sometimes this medication may be very expensive even though it is "covered".  If it is outrageously expensive he does not have to buy it and we can see what else might be done as there are other options though this is my first choice of treatment

## 2021-06-11 NOTE — Telephone Encounter (Signed)
The test is borderline positive in my opinion.  So not clear-cut.  Please give an update on his symptoms as far as bloating and diarrhea earlier the same worse or better and I will make a recommendation.

## 2021-06-11 NOTE — Telephone Encounter (Signed)
Pt made aware of results Pt states that's nothing has changed in regard to his symptoms . Pt states that he still has bloating, cramping, and diarrhea.  Please advise

## 2021-06-11 NOTE — Telephone Encounter (Signed)
Please see note below and advise  

## 2021-06-11 NOTE — Telephone Encounter (Signed)
Pt made aware of Dr. Carlean Purl recommendation.  Pt made aware of prescription and also the possibility of the medication being expensive and Dr. Carlean Purl recommendations. Pt verbalized understanding with all questions answered.

## 2021-06-12 DIAGNOSIS — Z1331 Encounter for screening for depression: Secondary | ICD-10-CM | POA: Diagnosis not present

## 2021-06-12 DIAGNOSIS — Z1339 Encounter for screening examination for other mental health and behavioral disorders: Secondary | ICD-10-CM | POA: Diagnosis not present

## 2021-06-12 DIAGNOSIS — K219 Gastro-esophageal reflux disease without esophagitis: Secondary | ICD-10-CM | POA: Diagnosis not present

## 2021-06-12 DIAGNOSIS — Z0000 Encounter for general adult medical examination without abnormal findings: Secondary | ICD-10-CM | POA: Diagnosis not present

## 2021-06-26 DIAGNOSIS — R030 Elevated blood-pressure reading, without diagnosis of hypertension: Secondary | ICD-10-CM | POA: Diagnosis not present

## 2021-06-26 DIAGNOSIS — Z6828 Body mass index (BMI) 28.0-28.9, adult: Secondary | ICD-10-CM | POA: Diagnosis not present

## 2021-06-26 DIAGNOSIS — G959 Disease of spinal cord, unspecified: Secondary | ICD-10-CM | POA: Diagnosis not present

## 2021-07-17 NOTE — Telephone Encounter (Signed)
Patient called to follow up with results after taking medication. Please advise.

## 2021-07-17 NOTE — Telephone Encounter (Signed)
Pt stated that the first week when he started taking the medication that (he did not feel any difference); second week he states that he was feeling  better: After completed the medication pt stated that the next week he was feeling Ok and now he states that he feels like he is reverting to where he was originally with the symptoms of bloating, loose stools and feeling gassy.  Please Advise

## 2021-07-17 NOTE — Telephone Encounter (Signed)
Have him watch symptoms for rest of December and report back in January.  He was slightly + on the breath test for SIBO - this is not an exact science and I would consider another round of antibiotics but think we should give a bit more time to see how things go

## 2021-07-18 NOTE — Telephone Encounter (Signed)
Pt notified of Dr. Carlean Purl recommendations. Pt verbalized understanding with all questions answered. Pt stated that he would call the first week in January to update [place] on his symptoms.

## 2021-08-21 DIAGNOSIS — D2262 Melanocytic nevi of left upper limb, including shoulder: Secondary | ICD-10-CM | POA: Diagnosis not present

## 2021-08-21 DIAGNOSIS — D2271 Melanocytic nevi of right lower limb, including hip: Secondary | ICD-10-CM | POA: Diagnosis not present

## 2021-08-21 DIAGNOSIS — D485 Neoplasm of uncertain behavior of skin: Secondary | ICD-10-CM | POA: Diagnosis not present

## 2021-08-21 DIAGNOSIS — L918 Other hypertrophic disorders of the skin: Secondary | ICD-10-CM | POA: Diagnosis not present

## 2021-08-21 DIAGNOSIS — D2272 Melanocytic nevi of left lower limb, including hip: Secondary | ICD-10-CM | POA: Diagnosis not present

## 2021-09-18 NOTE — Patient Instructions (Addendum)
Your blood pressure trend concerns me. I would like for you to buy/use a home cuff to check at least daily  Your goal is <135/85 on average.  Bring your home cuff and your log of blood pressures with you to next visit. Update me in 2-3 weeks by mychart.   I do think likely sprain of musculature of left lower arm- could certainly refer you to sports medicine but you wanted to monitor for now and try ice 3x a day for 20 minutes and allow [place] to refer if not improving  Sign release of information at the check out desk for last 3 years of notes and labs and any immunizations  Recommended follow up: Return in about 6 months (around 03/24/2022) for physical or sooner if needed. Just make sure full year from last physical. Unless BP is high at home.

## 2021-09-18 NOTE — Progress Notes (Signed)
Phone: [phone]   Subjective:  Patient presents today to establish care.  Prior patient of Dr. Ardeth Perfect with [facility] Chief Complaint  Patient presents with   Transitions Of Care   Gastroesophageal Reflux   elevated blood reading    Pt states he has been having elevated bp readings 150/90 in the past few weeks at different appointments and would like to discuss.    See problem oriented charting  The following were reviewed and entered/updated in epic: Past Medical History:  Diagnosis Date   Anxiety    Arthritis    OA   GERD (gastroesophageal reflux disease)    Grover's disease    has seen Dr. Ubaldo Glassing   IBS (irritable bowel syndrome)    MRSA (methicillin resistant staph aureus) culture positive    hx of due to ingrown hair    10  yrs ago   PONV (postoperative nausea and vomiting)    Tubular adenoma 01/28/2021   Patient Active Problem List   Diagnosis Date Noted   Aortic atherosclerosis ([place]) 09/24/2021    Priority: Medium    GAD (generalized anxiety disorder) 09/24/2021    Priority: Medium    Primary insomnia 09/24/2021    Priority: Medium    Small intestinal bacterial overgrowth (SIBO) ? 06/11/2021    Priority: Medium    History of adenomatous polyp of colon 09/24/2021    Priority: Low   Genital herpes 09/24/2021    Priority: Low   IBS (irritable bowel syndrome) 01/16/2021    Priority: Low   Primary osteoarthritis of right knee 02/06/2016    Priority: Low   Solitary pulmonary nodule 02/06/2016    Priority: Low   Past Surgical History:  Procedure Laterality Date   APPENDECTOMY     COLONOSCOPY  2013   Negative screening exam at [facility]  01/28/2021   tubular adenoma   KNEE SURGERY Right    2 surgeries prior to replacement on meniscus   left shoulder repair Left 06/2018   Dr. Berenice Primas   TOTAL KNEE ARTHROPLASTY Right 02/06/2016   Dr. Berenice Primas. Procedure: TOTAL KNEE ARTHROPLASTY;  Surgeon: Dorna Leitz, MD;  Location: [facility];   Service: Orthopedics;  Laterality: Right;    Family History  Problem Relation Age of Onset   Hypertension Mother    Irritable bowel syndrome Mother    Other Mother        pagets skin   Cancer Father        died within 49 days- unclear original cause- several mets   Heart attack Maternal Grandmother    Heart attack Maternal Grandfather    Heart attack Paternal Grandmother    Heart attack Paternal Grandfather    Colon cancer Neg Hx    Stomach cancer Neg Hx    Esophageal cancer Neg Hx    Pancreatic cancer Neg Hx    Rectal cancer Neg Hx     Medications- reviewed and updated Current Outpatient Medications  Medication Sig Dispense Refill   ascorbic acid (VITAMIN C) 1000 MG tablet Take 2,000 mg by mouth daily.     Cholecalciferol (VITAMIN D3) 50 MCG (2000 UT) TABS Take 1 tablet by mouth daily.     eszopiclone (LUNESTA) 1 MG TABS tablet Take 2 mg by mouth at bedtime as needed for sleep. Take immediately before bedtime     Multiple Vitamins-Minerals (ICAPS AREDS 2 PO) Take 2 capsules by mouth daily.     Multiple Vitamins-Minerals (MULTIVITAMIN WITH MINERALS) tablet Take 1  tablet by mouth daily.     omeprazole (PRILOSEC) 20 MG capsule Take 1 capsule (20 mg total) by mouth daily. 90 capsule 3   Probiotic Product (PROBIOTIC DAILY PO) Take 1 tablet by mouth daily.     escitalopram (LEXAPRO) 20 MG tablet Take 1 tablet (20 mg total) by mouth daily. 90 tablet 3   valACYclovir (VALTREX) 500 MG tablet Take 1 tablet (500 mg total) by mouth daily. 90 tablet 3   No current facility-administered medications for this visit.    Allergies-reviewed and updated No Known Allergies  Social History   Social History Narrative   Married in 2022. 1 daughter from prior marriage- 64 years old in 2023- no grandkids yet.       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 No rebound or guarding.  Ext: no edema Skin: warm, dry Neuro: Grossly normal, normal gait, normal reflexes    Assessment and Plan:   #elevated blood pressure S: medication: none -2-3 months of issues. Checked at home 150/95 and also around 150/101 at dentist yesterday. In past 120/80. Has seemed more flush per wife. August 2022 labs largely normal reportedly.  - taking aleve some but sparing - no increased salt in diet recently. Eats at home mostly- minimal prepackaged -walks 3-6 miles a day at work.  BP Readings from Last 3 Encounters:  09/24/21 140/82  04/24/21 140/88  01/28/21 119/80  A/P:  Your blood pressure trend concerns me. I would like for you to buy/use a home cuff to check at least daily  Your goal is <135/85 on average.  Bring your home cuff and your log of blood pressures with you to next visit. Update me in 2-3 weeks by mychart.   # MSK concerns- neck issues -working with Dr. Ellene Route on neck c3-c4 compressed- started after prior accident and may have fusion- chronic pain and worksinto both shoulders. Hoping by summertime.  -also history of arthritis and played many sports and weekend warrior heavy yardwork -history of right knee replacement  #Left wrist pain- pushing up with full weight on fence with left arm and since then has noted some swelling in the wrist as well as pain and restricted range of motion.  - I do think likely sprain of musculature of left lower arm- could certainly refer you to sports medicine but you wanted to monitor for now and try ice 3x a day for 20 minutes and allow [place] to refer if not improving  # GERD #IBS/dyspepsia - seen by GI Zehr, PA-C and  Dr.Gessner #SIBO S:Medication: omeprazole 20 mg daily  . History of SIBO ultimately diagnosed- treated with rifaximin- bloatsing better after 4-5 weeks but in last month or two started to worsen- mild bloating in LUQ.  Mild worsening in last few weeks- may call GI back.  A/P: GERD well controlled- refill rx SIBO- encouraged GI follow up for mild worsening symptoms IBS- doing well without meds - watches certain foods  #aortic atherosclerosis  S: Medication:none   A/P: We are going to get records to see what his LDL is-if above 70 we may need to consider statin or at least a discussion on CT cardiac scoring to see if he would not like more information before deciding on statin  # Anxiety/insomnia S:Medication: lexapro 20 mg. Had been on 10 mg.  - self descried type A personality.  He actually feels like he has made large strides in this but his wife is very concerned that this is poorly controlled  Counseling: does not see a therapist- not very open to this. Has seen one in the past - also on lunesta for insomnia- takes most nights. Prior [place] but stopped being effective. PDMP reviewed- low risk fill pattern- I can take over with next rx once we receive records A/P: Patient reports anxiety reasonably well controlled-this certainly sounds like GAD-continue current medication-his wife feels it is not well controlled-patient agrees to reach out if he feels like symptoms are worsening and we can check in next visit but for now continue current Lexapro 20 mg - We will await records before refilling Lunesta but I do see that he has received this through [facility] through past physician team  #History of adenomatous polyp of the colon-01/28/2021 with 7 year follow up with Dr. Carlean Purl  # HM- calling about Tdap dates and requesting release of information  #macular degeneration- follows with Dr. Baird Cancer- takes areds 2   #genital herpes- on suppressive 500mg  for 40 years- refill today   Recommended follow  up: Return in about 6 months (around 03/24/2022) for physical or sooner if needed.  He needs to make sure this is at least a full year from his last physical  Meds ordered this encounter  Medications   omeprazole (PRILOSEC) 20 MG capsule    Sig: Take 1 capsule (20 mg total) by mouth daily.    Dispense:  90 capsule    Refill:  3   escitalopram (LEXAPRO) 20 MG tablet    Sig: Take 1 tablet (20 mg total) by mouth daily.    Dispense:  90 tablet    Refill:  3   valACYclovir (VALTREX) 500 MG tablet    Sig: Take 1 tablet (500 mg total) by mouth daily.    Dispense:  90 tablet    Refill:  3   Time Spent: 51 minutes of total time (3:28 PM- 4:19 PM) was spent on the date of the encounter performing the following actions: chart review prior to seeing the patient, obtaining history, performing a medically necessary exam, counseling on the treatment plan, placing orders, and documenting in our EHR.   I,Jada Bradford,acting as a scribe for Garret Reddish, MD.,have documented all relevant documentation on the behalf of Garret Reddish, MD,as directed by  Garret Reddish, MD while in the presence of Garret Reddish, MD.  I, Garret Reddish, MD, have reviewed all documentation for this visit. The documentation on 09/24/21 for the exam, diagnosis, procedures, and orders are all accurate and complete.  Return precautions advised. Garret Reddish, MD

## 2021-09-24 ENCOUNTER — Ambulatory Visit: Payer: BC Managed Care – PPO | Admitting: Family Medicine

## 2021-09-24 ENCOUNTER — Encounter: Payer: Self-pay | Admitting: Family Medicine

## 2021-09-24 ENCOUNTER — Other Ambulatory Visit: Payer: Self-pay

## 2021-09-24 VITALS — BP 140/82 | HR 70 | Temp 98.8°F | Ht 72.0 in | Wt 209.0 lb

## 2021-09-24 DIAGNOSIS — K219 Gastro-esophageal reflux disease without esophagitis: Secondary | ICD-10-CM | POA: Diagnosis not present

## 2021-09-24 DIAGNOSIS — K589 Irritable bowel syndrome without diarrhea: Secondary | ICD-10-CM

## 2021-09-24 DIAGNOSIS — A6000 Herpesviral infection of urogenital system, unspecified: Secondary | ICD-10-CM | POA: Insufficient documentation

## 2021-09-24 DIAGNOSIS — F5101 Primary insomnia: Secondary | ICD-10-CM | POA: Diagnosis not present

## 2021-09-24 DIAGNOSIS — I700 Atherosclerosis of aorta: Secondary | ICD-10-CM | POA: Diagnosis not present

## 2021-09-24 DIAGNOSIS — R1013 Epigastric pain: Secondary | ICD-10-CM

## 2021-09-24 DIAGNOSIS — F411 Generalized anxiety disorder: Secondary | ICD-10-CM | POA: Insufficient documentation

## 2021-09-24 DIAGNOSIS — Z86010 Personal history of colonic polyps: Secondary | ICD-10-CM | POA: Insufficient documentation

## 2021-09-24 MED ORDER — OMEPRAZOLE 20 MG PO CPDR: 20.0000 mg | DELAYED_RELEASE_CAPSULE | Freq: Every day | ORAL | 3 refills | Status: DC

## 2021-09-24 MED ORDER — VALACYCLOVIR HCL 500 MG PO TABS: 500.0000 mg | ORAL_TABLET | Freq: Every day | ORAL | 3 refills | Status: DC

## 2021-09-24 MED ORDER — ESCITALOPRAM OXALATE 20 MG PO TABS: 20.0000 mg | ORAL_TABLET | Freq: Every day | ORAL | 3 refills | Status: DC

## 2021-11-18 ENCOUNTER — Telehealth: Payer: Self-pay

## 2021-11-18 NOTE — Telephone Encounter (Signed)
Error

## 2021-11-26 ENCOUNTER — Ambulatory Visit: Payer: BC Managed Care – PPO | Admitting: Family Medicine

## 2021-11-26 ENCOUNTER — Encounter: Payer: Self-pay | Admitting: Family Medicine

## 2021-11-26 VITALS — BP 119/80 | HR 70 | Temp 98.7°F | Ht 72.0 in | Wt 208.1 lb

## 2021-11-26 DIAGNOSIS — F411 Generalized anxiety disorder: Secondary | ICD-10-CM | POA: Diagnosis not present

## 2021-11-26 DIAGNOSIS — G9332 Myalgic encephalomyelitis/chronic fatigue syndrome: Secondary | ICD-10-CM | POA: Insufficient documentation

## 2021-11-26 DIAGNOSIS — B029 Zoster without complications: Secondary | ICD-10-CM | POA: Insufficient documentation

## 2021-11-26 DIAGNOSIS — L0201 Cutaneous abscess of face: Secondary | ICD-10-CM | POA: Insufficient documentation

## 2021-11-26 MED ORDER — BUSPIRONE HCL 5 MG PO TABS: 5.0000 mg | ORAL_TABLET | Freq: Two times a day (BID) | ORAL | 5 refills | Status: DC

## 2021-11-26 NOTE — Progress Notes (Signed)
?Phone [phone] ?In person visit ?  ?Subjective:  ? ?Micheal May is a 66 y.o. year old very pleasant male patient who presents for/with See problem oriented charting ?Chief Complaint  ?Patient presents with  ? Anxiety  ?  Pt states his stress level has been up.   ? ? ?This visit occurred during the SARS-CoV-2 public health emergency.  Safety protocols were in place, including screening questions prior to the visit, additional usage of staff PPE, and extensive cleaning of exam room while observing appropriate contact time as indicated for disinfecting solutions.  ? ?Past Medical History-  ?Patient Active Problem List  ? Diagnosis Date Noted  ? Aortic atherosclerosis ([place]) 09/24/2021  ?  Priority: Medium   ? GAD (generalized anxiety disorder) 09/24/2021  ?  Priority: Medium   ? Primary insomnia 09/24/2021  ?  Priority: Medium   ? Small intestinal bacterial overgrowth (SIBO) ? 06/11/2021  ?  Priority: Medium   ? History of adenomatous polyp of colon 09/24/2021  ?  Priority: Low  ? Genital herpes 09/24/2021  ?  Priority: Low  ? IBS (irritable bowel syndrome) 01/16/2021  ?  Priority: Low  ? Primary osteoarthritis of right knee 02/06/2016  ?  Priority: Low  ? Solitary pulmonary nodule 02/06/2016  ?  Priority: Low  ? Chronic fatigue syndrome 11/26/2021  ? Facial abscess 11/26/2021  ? Shingles 11/26/2021  ? Testicle pain 05/20/2012  ? ? ?Medications- reviewed and updated ?Current Outpatient Medications  ?Medication Sig Dispense Refill  ? ascorbic acid (VITAMIN C) 1000 MG tablet Take 2,000 mg by mouth daily.    ? busPIRone (BUSPAR) 5 MG tablet Take 1 tablet (5 mg total) by mouth 2 (two) times daily. 60 tablet 5  ? Cholecalciferol (VITAMIN D3) 50 MCG (2000 UT) TABS Take 1 tablet by mouth daily.    ? escitalopram (LEXAPRO) 20 MG tablet Take 1 tablet (20 mg total) by mouth daily. 90 tablet 3  ? eszopiclone (LUNESTA) 1 MG TABS tablet Take 2 mg by mouth at bedtime as needed for sleep. Take immediately before bedtime    ?  Multiple Vitamins-Minerals (ICAPS AREDS 2 PO) Take 2 capsules by mouth daily.    ? Multiple Vitamins-Minerals (MULTIVITAMIN WITH MINERALS) tablet Take 1 tablet by mouth daily.    ? omeprazole (PRILOSEC) 20 MG capsule Take 1 capsule (20 mg total) by mouth daily. 90 capsule 3  ? Probiotic Product (PROBIOTIC DAILY PO) Take 1 tablet by mouth daily.    ? valACYclovir (VALTREX) 500 MG tablet Take 1 tablet (500 mg total) by mouth daily. 90 tablet 3  ? ?No current facility-administered medications for this visit.  ? ?  ?Objective:  ?BP 119/80 (BP Location: Left Arm, Patient Position: Sitting, Cuff Size: Large)   Pulse 70   Temp 98.7 ?F (37.1 ?C) (Temporal)   Ht 6' (1.829 m)   Wt 208 lb 2 oz (94.4 kg)   SpO2 97%   BMI 28.23 kg/m?  ?Gen: NAD, resting comfortably ? ?  ? ?Assessment and Plan  ? ?# GAD ?S: Medication:lexapro 20 mg and has been on 5-6 years ? ?He reports ongoing stress- feels like overall stable but wife Lattie Haw feels much higher ? ?Stressors:  ?-moved mom from [place] to [facility] [employer] apt ?-work very stressful and they are behind- Product/process development scientist delays ?- harder to enjoy doing yardwork at the house due to stress of everything else piling on  ?-deadlines keep getting moved back and most recently moved back to [place] ?-  getting married but wife dealing with financial situation from prior marriage ?- type A personality ? ?  09/24/2021  ?  2:53 PM  ?Depression screen PHQ 2/9  ?Decreased Interest 0  ?Down, Depressed, Hopeless 0  ?PHQ - 2 Score 0  ?Altered sleeping 0  ?Tired, decreased energy 0  ?Change in appetite 0  ?Feeling bad or failure about yourself  0  ?Trouble concentrating 0  ?Moving slowly or fidgety/restless 0  ?Suicidal thoughts 0  ?PHQ-9 Score 0  ?Difficult doing work/chores Not difficult at all  ? ?A/P: GAD mild poor control with GAD 8. No clear depression with PHQ9 of 4.  ? ?From avs" ?Lets try low dose buspirone 5 mg twice daily  in addition to lexapro 20 mg.  ?-we discussed therapy but with  your time demands would be challenging at the moment ? ?Recommended follow up: Return for as needed for new, worsening, persistent symptoms. We can certainly look at other options sooner if not improving ?-cancel may 3rd visit  ?-otherwise keep august visit" ?-did join bowling league on Monday nights and that has been helpful ?-very active in yard/moves fences- several miles a day walking at work and home ? ?#elevated blood pressure  ?S: medication: none ?Home readings #s: if takes during week up to 140/90 and on weekends 120/80 ?BP Readings from Last 3 Encounters:  ?11/26/21 119/80  ?09/24/21 140/82  ?04/24/21 140/88  ?A/P: home monitoring likely shows parallel of high stress/higher BP- better on weekends. Also improves if he is able to detach from work or on a good day- I think focus should be on improving anxiety/stress ? ?Recommended follow up: Return for as needed for new, worsening, persistent symptoms. ?Future Appointments  ?Date Time Provider [place]  ?12/11/2021  8:20 AM Marin Olp, MD [facility]  ?03/27/2022 11:00 AM Marin Olp, MD [facility]  ? ?Lab/Order associations: ?  ICD-10-CM   ?1. GAD (generalized anxiety disorder)  F41.1   ?  ? ? ?Meds ordered this encounter  ?Medications  ? busPIRone (BUSPAR) 5 MG tablet  ?  Sig: Take 1 tablet (5 mg total) by mouth 2 (two) times daily.  ?  Dispense:  60 tablet  ?  Refill:  5  ? ? ?Return precautions advised.  ?Garret Reddish, MD ? ?

## 2021-11-26 NOTE — Patient Instructions (Addendum)
Lets try low dose buspirone 5 mg twice daily  in addition to lexapro 20 mg.  ?-we discussed therapy but with your time demands would be challenging at the moment ? ?Recommended follow up: Return for as needed for new, worsening, persistent symptoms. We can certainly look at other options sooner if not improving ?-cancel may 3rd visit at desk ?-otherwise keep august visit ?

## 2021-12-11 ENCOUNTER — Ambulatory Visit: Payer: BC Managed Care – PPO | Admitting: Family Medicine

## 2021-12-18 ENCOUNTER — Encounter: Payer: Self-pay | Admitting: Family Medicine

## 2021-12-25 ENCOUNTER — Other Ambulatory Visit: Payer: Self-pay | Admitting: Family Medicine

## 2022-02-19 ENCOUNTER — Other Ambulatory Visit: Payer: Self-pay | Admitting: Family Medicine

## 2022-03-26 DIAGNOSIS — H3550 Unspecified hereditary retinal dystrophy: Secondary | ICD-10-CM | POA: Diagnosis not present

## 2022-03-26 DIAGNOSIS — H353221 Exudative age-related macular degeneration, left eye, with active choroidal neovascularization: Secondary | ICD-10-CM | POA: Diagnosis not present

## 2022-03-27 ENCOUNTER — Encounter: Payer: Self-pay | Admitting: Family Medicine

## 2022-03-27 ENCOUNTER — Ambulatory Visit (INDEPENDENT_AMBULATORY_CARE_PROVIDER_SITE_OTHER): Payer: BC Managed Care – PPO | Admitting: Family Medicine

## 2022-03-27 VITALS — BP 128/78 | HR 64 | Temp 98.0°F | Ht 72.0 in | Wt 205.6 lb

## 2022-03-27 DIAGNOSIS — F411 Generalized anxiety disorder: Secondary | ICD-10-CM

## 2022-03-27 DIAGNOSIS — Z114 Encounter for screening for human immunodeficiency virus [HIV]: Secondary | ICD-10-CM | POA: Diagnosis not present

## 2022-03-27 DIAGNOSIS — Z79899 Other long term (current) drug therapy: Secondary | ICD-10-CM | POA: Diagnosis not present

## 2022-03-27 DIAGNOSIS — Z125 Encounter for screening for malignant neoplasm of prostate: Secondary | ICD-10-CM | POA: Diagnosis not present

## 2022-03-27 DIAGNOSIS — I700 Atherosclerosis of aorta: Secondary | ICD-10-CM | POA: Diagnosis not present

## 2022-03-27 DIAGNOSIS — Z1159 Encounter for screening for other viral diseases: Secondary | ICD-10-CM

## 2022-03-27 DIAGNOSIS — H3530 Unspecified macular degeneration: Secondary | ICD-10-CM

## 2022-03-27 DIAGNOSIS — Z0000 Encounter for general adult medical examination without abnormal findings: Secondary | ICD-10-CM

## 2022-03-27 DIAGNOSIS — Z23 Encounter for immunization: Secondary | ICD-10-CM

## 2022-03-27 DIAGNOSIS — Z13220 Encounter for screening for lipoid disorders: Secondary | ICD-10-CM | POA: Diagnosis not present

## 2022-03-27 LAB — CBC WITH DIFFERENTIAL/PLATELET
Basophils Absolute: 0 10*3/uL (ref 0.0–0.1)
Basophils Relative: 0.6 % (ref 0.0–3.0)
Eosinophils Absolute: 0.2 10*3/uL (ref 0.0–0.7)
Eosinophils Relative: 3.5 % (ref 0.0–5.0)
HCT: 43.4 % (ref 39.0–52.0)
Hemoglobin: 15 g/dL (ref 13.0–17.0)
Lymphocytes Relative: 14.7 % (ref 12.0–46.0)
Lymphs Abs: 0.7 10*3/uL (ref 0.7–4.0)
MCHC: 34.5 g/dL (ref 30.0–36.0)
MCV: 90.5 fl (ref 78.0–100.0)
Monocytes Absolute: 0.6 10*3/uL (ref 0.1–1.0)
Monocytes Relative: 10.9 % (ref 3.0–12.0)
Neutro Abs: 3.6 10*3/uL (ref 1.4–7.7)
Neutrophils Relative %: 70.3 % (ref 43.0–77.0)
Platelets: 191 10*3/uL (ref 150.0–400.0)
RBC: 4.8 Mil/uL (ref 4.22–5.81)
RDW: 13.5 % (ref 11.5–15.5)
WBC: 5.1 10*3/uL (ref 4.0–10.5)

## 2022-03-27 LAB — LIPID PANEL
Cholesterol: 210 mg/dL — ABNORMAL HIGH (ref 0–200)
HDL: 46.4 mg/dL (ref >39.00)
LDL Cholesterol: 139 mg/dL — ABNORMAL HIGH (ref 0–99)
NonHDL: 163.8
Total CHOL/HDL Ratio: 5
Triglycerides: 125 mg/dL (ref 0.0–149.0)
VLDL: 25 mg/dL (ref 0.0–40.0)

## 2022-03-27 LAB — COMPREHENSIVE METABOLIC PANEL
ALT: 23 U/L (ref 0–53)
AST: 17 U/L (ref 0–37)
Albumin: 4.6 g/dL (ref 3.5–5.2)
Alkaline Phosphatase: 95 U/L (ref 39–117)
BUN: 18 mg/dL (ref 6–23)
CO2: 28 mEq/L (ref 19–32)
Calcium: 9.2 mg/dL (ref 8.4–10.5)
Chloride: 102 mEq/L (ref 96–112)
Creatinine, Ser: 0.8 mg/dL (ref 0.40–1.50)
GFR: 93.98 mL/min (ref >60.00)
Glucose, Bld: 97 mg/dL (ref 70–99)
Potassium: 4.2 mEq/L (ref 3.5–5.1)
Sodium: 138 mEq/L (ref 135–145)
Total Bilirubin: 1 mg/dL (ref 0.2–1.2)
Total Protein: 6.9 g/dL (ref 6.0–8.3)

## 2022-03-27 LAB — VITAMIN B12: Vitamin B-12: 733 pg/mL (ref 211–911)

## 2022-03-27 LAB — PSA: PSA: 0.85 ng/mL (ref 0.10–4.00)

## 2022-03-27 MED ORDER — BUSPIRONE HCL 5 MG PO TABS
5.0000 mg | ORAL_TABLET | Freq: Three times a day (TID) | ORAL | 3 refills | Status: DC
Start: 2022-03-27 — End: 2023-06-08

## 2022-03-27 NOTE — Progress Notes (Signed)
Phone: [phone]   Subjective:  Patient presents today for their annual physical. Chief complaint-noted.   See problem oriented charting- ROS- full  review of systems was completed and negative  except for: visual problems- seeing eye doctor- macular degeneration went to wet and will start with injections, back pain, knee pain (sees Dr Berenice Primas Monday), neck pain  The following were reviewed and entered/updated in epic: Past Medical History:  Diagnosis Date   Anxiety    Arthritis    OA   GERD (gastroesophageal reflux disease)    Grover's disease    has seen Dr. Ubaldo Glassing   IBS (irritable bowel syndrome)    MRSA (methicillin resistant staph aureus) culture positive    hx of due to ingrown hair    10  yrs ago   PONV (postoperative nausea and vomiting)    Tubular adenoma 01/28/2021   Patient Active Problem List   Diagnosis Date Noted   Macular degeneration 03/27/2022    Priority: Medium    Aortic atherosclerosis ([place]) 09/24/2021    Priority: Medium    GAD (generalized anxiety disorder) 09/24/2021    Priority: Medium    Primary insomnia 09/24/2021    Priority: Medium    Small intestinal bacterial overgrowth (SIBO) ? 06/11/2021    Priority: Medium    History of adenomatous polyp of colon 09/24/2021    Priority: Low   Genital herpes 09/24/2021    Priority: Low   IBS (irritable bowel syndrome) 01/16/2021    Priority: Low   Primary osteoarthritis of right knee 02/06/2016    Priority: Low   Solitary pulmonary nodule 02/06/2016    Priority: Low   Chronic fatigue syndrome 11/26/2021   Facial abscess 11/26/2021   Shingles 11/26/2021   Testicle pain 05/20/2012   Past Surgical History:  Procedure Laterality Date   APPENDECTOMY     COLONOSCOPY  2013   Negative screening exam at [facility]  01/28/2021   tubular adenoma   KNEE SURGERY Right    2 surgeries prior to replacement on meniscus   left shoulder repair Left 06/2018   Dr. Berenice Primas   TOTAL KNEE  ARTHROPLASTY Right 02/06/2016   Dr. Berenice Primas. Procedure: TOTAL KNEE ARTHROPLASTY;  Surgeon: Dorna Leitz, MD;  Location: [facility];  Service: Orthopedics;  Laterality: Right;    Family History  Problem Relation Age of Onset   Hypertension Mother    Irritable bowel syndrome Mother    Other Mother        pagets skin   Cancer Father        died within 51 days- unclear original cause- several mets   Heart attack Maternal Grandmother    Heart attack Maternal Grandfather    Heart attack Paternal Grandmother    Heart attack Paternal Grandfather    Colon cancer Neg Hx    Stomach cancer Neg Hx    Esophageal cancer Neg Hx    Pancreatic cancer Neg Hx    Rectal cancer Neg Hx     Medications- reviewed and updated Current Outpatient Medications  Medication Sig Dispense Refill   ascorbic acid (VITAMIN C) 1000 MG tablet Take 2,000 mg by mouth daily.     Cholecalciferol (VITAMIN D3) 50 MCG (2000 UT) TABS Take 1 tablet by mouth daily.     escitalopram (LEXAPRO) 20 MG tablet Take 1 tablet (20 mg total) by mouth daily. 90 tablet 3   eszopiclone (LUNESTA) 2 MG TABS tablet TAKE ONE TABLET BY MOUTH EVERYDAY AT BEDTIME AS  NEEDED FOR INSOMNIA 30 tablet 3   Multiple Vitamins-Minerals (ICAPS AREDS 2 PO) Take 2 capsules by mouth daily.     Multiple Vitamins-Minerals (MULTIVITAMIN WITH MINERALS) tablet Take 1 tablet by mouth daily.     omeprazole (PRILOSEC) 20 MG capsule Take 1 capsule (20 mg total) by mouth daily. 90 capsule 3   Probiotic Product (PROBIOTIC DAILY PO) Take 1 tablet by mouth daily.     sildenafil (VIAGRA) 100 MG tablet TAKE ONE TABLET BY MOUTH DAILY AS NEEDED 148 tablet 0   valACYclovir (VALTREX) 500 MG tablet Take 1 tablet (500 mg total) by mouth daily. 90 tablet 3   busPIRone (BUSPAR) 5 MG tablet Take 1 tablet (5 mg total) by mouth 3 (three) times daily. 270 tablet 3   No current facility-administered medications for this visit.    Allergies-reviewed and updated No Known Allergies  Social  History   Social History Narrative   Married in 2022. 1 daughter from prior marriage- 18 years old in 2023- no grandkids yet.       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 Oropharynx normal Neck: no thyromegaly CV: RRR no murmurs rubs or gallops Lungs: CTAB no crackles, wheeze, rhonchi Abdomen: soft/nontender/nondistended/normal bowel sounds. No rebound or guarding.  Ext: no edema Skin: warm, dry Neuro: grossly normal, moves all extremities, PERRLA    Assessment and Plan  66 y.o. male presenting for annual physical.  Health Maintenance counseling: 1. Anticipatory guidance: Patient counseled regarding regular dental exams -q4 months, eye exams -yearly- far more regular now with wet macular degeneration,   avoiding smoking and second hand smoke , limiting alcohol to 2 beverages per day - well under, no illicit drugs .   2. Risk factor reduction:  Advised patient of need for regular exercise and diet rich and fruits and vegetables to reduce risk of heart attack and stroke.  Exercise- remains active- gardening, bowling and gets 3-5 miles per day with work- could consider strength training but does aggressively work in the yard- large yard and a lot to do.  Diet/weight management-mild gradual weight loss recommended- got as low as 193 in the summer.  Wt Readings from Last 3 Encounters:  03/27/22 205 lb 9.6 oz (93.3 kg)  11/26/21 208 lb 2 oz (94.4 kg)  09/24/21 209 lb (94.8 kg)  3. 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 Prostate cancer screening- - psa ranged with [facility] in recent years 0.5- 1.2- update with labs  5. Colon cancer screening - January 28, 2021 with 7-year repeat planned 6. Skin cancer screening- sees dermatology yearly. advised regular sunscreen use. Denies worrisome, changing, or new skin lesions.  7. Smoking associated screening (lung cancer screening, AAA screen 65-75, UA)- never smoker 8. STD screening - only active with wife  Status of chronic or acute  concerns   # Anxiety S:Medication: Lexapro 20 mg for at least 5 years, recent addition of buspirone 5 mg BID Counseling: not right now due to time constraints    03/27/2022   11:04 AM 11/26/2021    3:50 PM  GAD 7 : Generalized Anxiety Score  Nervous, Anxious, on Edge 0 2  Control/stop worrying 0 2  Worry too much - different things 2 1  Trouble relaxing 1 1  Restless 1 0  Easily annoyed or irritable 0 2  Afraid - awful might happen 0 0  Total GAD 7 Score 4 8  Anxiety Difficulty Not difficult at all Somewhat difficult  A/P: GAD-7 down from 8-4-significant improvement - would like to try 3x a day and I think that's reasonable- order updated   #Aortic atherosclerosis-noted on prior imaging-we will also screen for hyperlipidemia-May need statin with LDL target under 70   #Insomnia S: Medication: Eszopiclone 2 mg A/P: Controlled. Continue current medications.  - doesn't drive for 8 hours after taking    # GERD S:Medication: Omeprazole 20 mg- issues if misses a dose- 30 yeras B12 levels related to PPI use: No results found for: "VITAMINB12" A/P: Controlled. Continue current medications.  -check b12 with long term PPI   Recommended follow up: Return in about 1 year (around 03/28/2023) for physical or sooner if  needed.Schedule b4 you leave.  Lab/Order associations:NOT fasting- fruit, yogurt, bagel this am   ICD-10-CM   1. Preventative health care  Z00.00     2. GAD (generalized anxiety disorder)  F41.1 CBC with Differential/Platelet    Comprehensive metabolic panel    3. Aortic atherosclerosis (HCC)  I70.0     4. Screening for prostate cancer  Z12.5 PSA    5. Screening for hyperlipidemia  Z13.220 Lipid panel    6. Encounter for hepatitis C screening test for low risk patient  Z11.59 Hepatitis C antibody    7. Screening for HIV (human immunodeficiency virus)  Z11.4 HIV Antibody (routine testing w rflx)    8. High risk medication use  Z79.899 Vitamin B12    9. Macular degeneration, unspecified laterality, unspecified type  H35.30      Meds ordered this encounter  Medications   busPIRone (BUSPAR) 5 MG tablet    Sig: Take 1 tablet (5 mg total) by mouth 3 (three) times daily.    Dispense:  270 tablet    Refill:  3    Return precautions advised.  Garret Reddish, MD

## 2022-03-27 NOTE — Addendum Note (Signed)
Addended by: Clyde Lundborg A on: 03/27/2022 11:53 AM   Modules accepted: Orders

## 2022-03-27 NOTE — Patient Instructions (Addendum)
Tdap today  Please stop by lab before you go If you have mychart- we will send your results within 3 business days of [place] receiving them.  If you do not have mychart- we will call you about results within 5 business days of [place] receiving them.  *please also note that you will see labs on mychart as soon as they post. I will later go in and write notes on them- will say "notes from Dr. Yong Channel"   Recommended follow up: Return in about 1 year (around 03/28/2023) for physical or sooner if needed.Schedule b4 you leave.

## 2022-03-28 ENCOUNTER — Other Ambulatory Visit: Payer: Self-pay

## 2022-03-28 LAB — HEPATITIS C ANTIBODY: Hepatitis C Ab: NONREACTIVE

## 2022-03-28 LAB — HIV ANTIBODY (ROUTINE TESTING W REFLEX): HIV 1&2 Ab, 4th Generation: NONREACTIVE

## 2022-03-28 MED ORDER — ROSUVASTATIN CALCIUM 5 MG PO TABS: 5.0000 mg | ORAL_TABLET | Freq: Every day | ORAL | 3 refills | Status: DC

## 2022-03-31 DIAGNOSIS — M25561 Pain in right knee: Secondary | ICD-10-CM | POA: Diagnosis not present

## 2022-04-11 DIAGNOSIS — H3550 Unspecified hereditary retinal dystrophy: Secondary | ICD-10-CM | POA: Diagnosis not present

## 2022-04-11 DIAGNOSIS — H43813 Vitreous degeneration, bilateral: Secondary | ICD-10-CM | POA: Diagnosis not present

## 2022-04-11 DIAGNOSIS — H353221 Exudative age-related macular degeneration, left eye, with active choroidal neovascularization: Secondary | ICD-10-CM | POA: Diagnosis not present

## 2022-04-11 DIAGNOSIS — H353112 Nonexudative age-related macular degeneration, right eye, intermediate dry stage: Secondary | ICD-10-CM | POA: Diagnosis not present

## 2022-04-20 ENCOUNTER — Other Ambulatory Visit: Payer: Self-pay | Admitting: Family Medicine

## 2022-04-22 DIAGNOSIS — H353221 Exudative age-related macular degeneration, left eye, with active choroidal neovascularization: Secondary | ICD-10-CM | POA: Diagnosis not present

## 2022-04-22 DIAGNOSIS — H43812 Vitreous degeneration, left eye: Secondary | ICD-10-CM | POA: Diagnosis not present

## 2022-04-29 DIAGNOSIS — M25561 Pain in right knee: Secondary | ICD-10-CM | POA: Diagnosis not present

## 2022-05-23 DIAGNOSIS — H353221 Exudative age-related macular degeneration, left eye, with active choroidal neovascularization: Secondary | ICD-10-CM | POA: Diagnosis not present

## 2022-05-23 DIAGNOSIS — H353112 Nonexudative age-related macular degeneration, right eye, intermediate dry stage: Secondary | ICD-10-CM | POA: Diagnosis not present

## 2022-05-23 DIAGNOSIS — H3550 Unspecified hereditary retinal dystrophy: Secondary | ICD-10-CM | POA: Diagnosis not present

## 2022-05-23 DIAGNOSIS — H43813 Vitreous degeneration, bilateral: Secondary | ICD-10-CM | POA: Diagnosis not present

## 2022-06-24 DIAGNOSIS — H43813 Vitreous degeneration, bilateral: Secondary | ICD-10-CM | POA: Diagnosis not present

## 2022-06-24 DIAGNOSIS — H3550 Unspecified hereditary retinal dystrophy: Secondary | ICD-10-CM | POA: Diagnosis not present

## 2022-06-24 DIAGNOSIS — H353221 Exudative age-related macular degeneration, left eye, with active choroidal neovascularization: Secondary | ICD-10-CM | POA: Diagnosis not present

## 2022-06-24 DIAGNOSIS — H353112 Nonexudative age-related macular degeneration, right eye, intermediate dry stage: Secondary | ICD-10-CM | POA: Diagnosis not present

## 2022-07-15 DIAGNOSIS — M1612 Unilateral primary osteoarthritis, left hip: Secondary | ICD-10-CM | POA: Diagnosis not present

## 2022-07-15 DIAGNOSIS — M7062 Trochanteric bursitis, left hip: Secondary | ICD-10-CM | POA: Diagnosis not present

## 2022-07-22 DIAGNOSIS — H353221 Exudative age-related macular degeneration, left eye, with active choroidal neovascularization: Secondary | ICD-10-CM | POA: Diagnosis not present

## 2022-08-19 DIAGNOSIS — H353221 Exudative age-related macular degeneration, left eye, with active choroidal neovascularization: Secondary | ICD-10-CM | POA: Diagnosis not present

## 2022-08-19 DIAGNOSIS — H43813 Vitreous degeneration, bilateral: Secondary | ICD-10-CM | POA: Diagnosis not present

## 2022-08-19 DIAGNOSIS — H35722 Serous detachment of retinal pigment epithelium, left eye: Secondary | ICD-10-CM | POA: Diagnosis not present

## 2022-08-20 ENCOUNTER — Other Ambulatory Visit: Payer: Self-pay | Admitting: Family Medicine

## 2022-08-29 DIAGNOSIS — B353 Tinea pedis: Secondary | ICD-10-CM | POA: Diagnosis not present

## 2022-08-29 DIAGNOSIS — L820 Inflamed seborrheic keratosis: Secondary | ICD-10-CM | POA: Diagnosis not present

## 2022-08-29 DIAGNOSIS — L821 Other seborrheic keratosis: Secondary | ICD-10-CM | POA: Diagnosis not present

## 2022-08-29 DIAGNOSIS — L720 Epidermal cyst: Secondary | ICD-10-CM | POA: Diagnosis not present

## 2022-08-29 DIAGNOSIS — L570 Actinic keratosis: Secondary | ICD-10-CM | POA: Diagnosis not present

## 2022-08-29 DIAGNOSIS — D225 Melanocytic nevi of trunk: Secondary | ICD-10-CM | POA: Diagnosis not present

## 2022-08-29 DIAGNOSIS — D2272 Melanocytic nevi of left lower limb, including hip: Secondary | ICD-10-CM | POA: Diagnosis not present

## 2022-08-29 DIAGNOSIS — D485 Neoplasm of uncertain behavior of skin: Secondary | ICD-10-CM | POA: Diagnosis not present

## 2022-09-02 ENCOUNTER — Ambulatory Visit: Payer: BC Managed Care – PPO | Admitting: Internal Medicine

## 2022-09-02 ENCOUNTER — Encounter: Payer: Self-pay | Admitting: Internal Medicine

## 2022-09-02 VITALS — BP 109/69 | HR 86 | Temp 97.9°F | Ht 72.0 in | Wt 203.8 lb

## 2022-09-02 DIAGNOSIS — J328 Other chronic sinusitis: Secondary | ICD-10-CM | POA: Diagnosis not present

## 2022-09-02 MED ORDER — AMOXICILLIN-POT CLAVULANATE 875-125 MG PO TABS: 1.0000 | ORAL_TABLET | Freq: Two times a day (BID) | ORAL | 0 refills | Status: DC

## 2022-09-02 MED ORDER — LORATADINE 10 MG PO TABS: 10.0000 mg | ORAL_TABLET | Freq: Every day | ORAL | 11 refills | Status: DC

## 2022-09-02 MED ORDER — FLUTICASONE PROPIONATE 50 MCG/ACT NA SUSP: 2.0000 | Freq: Every day | NASAL | 6 refills | Status: DC

## 2022-09-02 MED ORDER — PSEUDOEPHEDRINE HCL ER 120 MG PO TB12: 120.0000 mg | ORAL_TABLET | Freq: Two times a day (BID) | ORAL | 0 refills | Status: DC

## 2022-09-02 MED ORDER — SIMPLY SALINE 0.9 % NA AERS: 2.0000 | INHALATION_SPRAY | NASAL | 11 refills | Status: DC

## 2022-09-02 NOTE — Progress Notes (Signed)
[facility]: [phone]   Routine Medical Office Visit  Patient:  Micheal May      Age: 67 y.o.       Sex:  male  Date:   09/02/2022  PCP:    Marin Olp, [place] Provider: Loralee Pacas, MD   Problem Focused Charting:   Medical Decision Making per Assessment/Plan   Kayven was seen today for nasal congestion, cough, headache, covid negative and abdominal pain.  Other chronic sinusitis -     Amoxicillin-Pot Clavulanate; Take 1 tablet by mouth 2 (two) times daily.  Dispense: 20 tablet; Refill: 0 -     Simply Saline; Place 2 each into the nose as directed. Use nightly for sinus hygiene long-term.  Can also be used as many times daily as desired to assist with clearing congested sinuses.  Dispense: 127 mL; Refill: 11 -     Fluticasone Propionate; Place 2 sprays into both nostrils daily.  Dispense: 16 g; Refill: 6 -     Loratadine; Take 1 tablet (10 mg total) by mouth daily.  Dispense: 30 tablet; Refill: 11 -     Pseudoephedrine HCl ER; Take 1 tablet (120 mg total) by mouth 2 (two) times daily.  Dispense: 20 tablet; Refill: 0   Suspect viral acute sinusitis, not COVID, but he has history of pneumonia evolving from similar sinus infection, so I agreed to antibiotic(s) and he agreed to try to treat with all the other medications instead for a few days or unless symptoms become more severe.   Today's key discussion points and After Visit Summary (AVS) reminders. Problem-associated medical records were reviewed with him during the appointment He was encouraged to contact our office by phone or message via MyChart if he has any questions or concerns regarding our treatment plan (see AVS). He was given an opportunity to ask questions/clarifications about any aspect of the diagnosis and treatment plan at today's visit.  Common side effects, risks, benefits, and alternatives for medications and treatment plan prescribed today were discussed     Subjective - Clinical Presentation:   Micheal May is a 67 y.o. male  Patient Active Problem List   Diagnosis Date Noted   Macular degeneration 03/27/2022   Chronic fatigue syndrome 11/26/2021   Facial abscess 11/26/2021   Shingles 11/26/2021   Aortic atherosclerosis ([place]) 09/24/2021   GAD (generalized anxiety disorder) 09/24/2021   Primary insomnia 09/24/2021   History of adenomatous polyp of colon 09/24/2021   Genital herpes 09/24/2021   Small intestinal bacterial overgrowth (SIBO) ? 06/11/2021   IBS (irritable bowel syndrome) 01/16/2021   Primary osteoarthritis of right knee 02/06/2016   Solitary pulmonary nodule 02/06/2016   Testicle pain 05/20/2012   Past Medical History:  Diagnosis Date   Anxiety    Arthritis    OA   GERD (gastroesophageal reflux disease)    Grover's disease    has seen Dr. Ubaldo Glassing   IBS (irritable bowel syndrome)    MRSA (methicillin resistant staph aureus) culture positive    hx of due to ingrown hair    10  yrs ago   PONV (postoperative nausea and vomiting)    Tubular adenoma 01/28/2021    Outpatient Medications Prior to Visit  Medication Sig   ascorbic acid (VITAMIN C) 1000 MG tablet Take 2,000 mg by mouth daily.   busPIRone (BUSPAR) 5 MG tablet Take 1 tablet (5 mg total) by mouth 3 (three) times daily.   Cholecalciferol (VITAMIN D3) 50  MCG (2000 UT) TABS Take 1 tablet by mouth daily.   escitalopram (LEXAPRO) 20 MG tablet Take 1 tablet (20 mg total) by mouth daily.   eszopiclone (LUNESTA) 2 MG TABS tablet TAKE ONE TABLET BY MOUTH EVERYDAY AT BEDTIME AS NEEDED FOR INSOMNIA   meloxicam (MOBIC) 15 MG tablet Take 15 mg by mouth daily.   Multiple Vitamins-Minerals (ICAPS AREDS 2 PO) Take 2 capsules by mouth daily.   Multiple Vitamins-Minerals (MULTIVITAMIN WITH MINERALS) tablet Take 1 tablet by mouth daily.   omeprazole (PRILOSEC) 20 MG capsule Take 1 capsule (20 mg total) by mouth daily.   Probiotic Product (PROBIOTIC DAILY PO) Take 1 tablet by  mouth daily.   rosuvastatin (CRESTOR) 5 MG tablet Take 1 tablet (5 mg total) by mouth daily.   sildenafil (VIAGRA) 100 MG tablet TAKE ONE TABLET BY MOUTH DAILY AS NEEDED   tobramycin (TOBREX) 0.3 % ophthalmic solution Place 1 drop into the left eye 4 (four) times daily.   valACYclovir (VALTREX) 500 MG tablet Take 1 tablet (500 mg total) by mouth daily.   No facility-administered medications prior to visit.    Chief Complaint  Patient presents with   Nasal Congestion    Symptoms started on Friday, has gotten worse. Took OTC medication for congestion. No fever currently or previously.    Cough    Productive-yellowish brown mucus.   Headache   COVID negative    As of 1/22.   Abdominal Pain    Empty and gassy feeling.    Cough This is a new problem. The current episode started in the past 7 days. The problem has been gradually worsening. The cough is Productive of brown sputum. Associated symptoms include headaches, nasal congestion, postnasal drip and rhinorrhea. Pertinent negatives include no chest pain, chills, ear congestion, ear pain, fever, heartburn, hemoptysis, myalgias, rash, sore throat, shortness of breath, sweats, weight loss or wheezing. He has tried OTC cough suppressant for the symptoms. The treatment provided mild relief. His past medical history is significant for pneumonia. There is no history of asthma, bronchitis, COPD or emphysema.  Headache  This is a new problem. The problem occurs daily. The problem has been unchanged. The pain is located in the Bilateral and frontal region. The pain does not radiate. The pain quality is not similar to prior headaches. The quality of the pain is described as throbbing. The pain is moderate. Associated symptoms include abdominal pain (has ibs but feels unrelated gassy bloating), coughing, drainage, eye watering and rhinorrhea. Pertinent negatives include no anorexia, blurred vision, dizziness, ear pain, fever, hearing loss, muscle aches,  neck pain, sore throat or weight loss.  Abdominal Pain Associated symptoms include headaches. Pertinent negatives include no anorexia, fever, myalgias or weight loss.    Ptco massive head cold and cough starting frday night         Objective:  Physical Exam  BP 109/69 (BP Location: Left Arm, Patient Position: Sitting)   Pulse 86   Temp 97.9 F (36.6 C) (Temporal)   Ht 6' (1.829 m)   Wt 203 lb 12.8 oz (92.4 kg)   SpO2 97%   BMI 27.64 kg/m   Overweight  by BMI criteria but truncal adiposity (waist circumference or caliper) should be used instead. Wt Readings from Last 10 Encounters:  09/02/22 203 lb 12.8 oz (92.4 kg)  03/27/22 205 lb 9.6 oz (93.3 kg)  11/26/21 208 lb 2 oz (94.4 kg)  09/24/21 209 lb (94.8 kg)  04/24/21 209 lb (94.8 kg)  01/28/21 208 lb (94.3 kg)  01/16/21 208 lb 8 oz (94.6 kg)  02/23/18 205 lb (93 kg)  11/25/17 201 lb (91.2 kg)  09/29/17 202 lb 9.6 oz (91.9 kg)    Vital signs reviewed.  Nursing notes reviewed. Weight trend reviewed.  All stable. General Appearance:  Well developed, well nourished male in no acute distress.   Normal work of breathing at rest Musculoskeletal: All extremities are intact.  Neurological:  Awake, alert,  No obvious focal neurological deficits or cognitive impairments Psychiatric:  Appropriate mood, pleasant demeanor Problem-specific findings:  sniffling, pink boggy nasal mucosa with clear secretions, poor oropharyngeal visualizability, scarring/partial opacification bilateral tympanic membrane, clear to auscultation bilaterally    Results Reviewed: Results for orders placed or performed in visit on 03/27/22  HIV Antibody (routine testing w rflx)  Result Value Ref Range   HIV 1&2 Ab, 4th Generation NON-REACTIVE NON-REACTIVE  Hepatitis C antibody  Result Value Ref Range   Hepatitis C Ab NON-REACTIVE NON-REACTIVE  CBC with Differential/Platelet  Result Value Ref Range   WBC 5.1 4.0 - 10.5 K/uL   RBC 4.80 4.22 - 5.81 Mil/uL    Hemoglobin 15.0 13.0 - 17.0 g/dL   HCT 43.4 39.0 - 52.0 %   MCV 90.5 78.0 - 100.0 fl   MCHC 34.5 30.0 - 36.0 g/dL   RDW 13.5 11.5 - 15.5 %   Platelets 191.0 150.0 - 400.0 K/uL   Neutrophils Relative % 70.3 43.0 - 77.0 %   Lymphocytes Relative 14.7 12.0 - 46.0 %   Monocytes Relative 10.9 3.0 - 12.0 %   Eosinophils Relative 3.5 0.0 - 5.0 %   Basophils Relative 0.6 0.0 - 3.0 %   Neutro Abs 3.6 1.4 - 7.7 K/uL   Lymphs Abs 0.7 0.7 - 4.0 K/uL   Monocytes Absolute 0.6 0.1 - 1.0 K/uL   Eosinophils Absolute 0.2 0.0 - 0.7 K/uL   Basophils Absolute 0.0 0.0 - 0.1 K/uL  Comprehensive metabolic panel  Result Value Ref Range   Sodium 138 135 - 145 mEq/L   Potassium 4.2 3.5 - 5.1 mEq/L   Chloride 102 96 - 112 mEq/L   CO2 28 19 - 32 mEq/L   Glucose, Bld 97 70 - 99 mg/dL   BUN 18 6 - 23 mg/dL   Creatinine, Ser 0.80 0.40 - 1.50 mg/dL   Total Bilirubin 1.0 0.2 - 1.2 mg/dL   Alkaline Phosphatase 95 39 - 117 U/L   AST 17 0 - 37 U/L   ALT 23 0 - 53 U/L   Total Protein 6.9 6.0 - 8.3 g/dL   Albumin 4.6 3.5 - 5.2 g/dL   GFR 93.98 >60.00 mL/min   Calcium 9.2 8.4 - 10.5 mg/dL  Lipid panel  Result Value Ref Range   Cholesterol 210 (H) 0 - 200 mg/dL   Triglycerides 125.0 0.0 - 149.0 mg/dL   HDL 46.40 >39.00 mg/dL   VLDL 25.0 0.0 - 40.0 mg/dL   LDL Cholesterol 139 (H) 0 - 99 mg/dL   Total CHOL/HDL Ratio 5    NonHDL 163.80   PSA  Result Value Ref Range   PSA 0.85 0.10 - 4.00 ng/mL  Vitamin B12  Result Value Ref Range   Vitamin B-12 733 211 - 911 pg/mL         Signed: Loralee Pacas, MD 09/02/2022 2:22 PM

## 2022-09-04 ENCOUNTER — Telehealth: Payer: Self-pay | Admitting: Family Medicine

## 2022-09-04 NOTE — Telephone Encounter (Signed)
Patient is requesting advice on if he should take the abx that Dr. Randol Kern prescribed on 09/02/22. States Dr. Randol Kern advised him on not taking the medication unless symptoms become severe. Pt states he is getting worse and just wanted to confirm that he could start the medication. Please Advise.

## 2022-09-05 NOTE — Telephone Encounter (Signed)
See below, you guys saw pt on 01/23.

## 2022-09-05 NOTE — Telephone Encounter (Signed)
Pts wife called again and states he is getting worse and would like a call back as soon as you can. Please advise.

## 2022-09-08 ENCOUNTER — Other Ambulatory Visit: Payer: Self-pay | Admitting: Family Medicine

## 2022-09-11 NOTE — Telephone Encounter (Signed)
Left a vm for patient last week advising him to go ahead and take the antibiotic per Dr.Morrison.

## 2022-09-26 ENCOUNTER — Other Ambulatory Visit: Payer: Self-pay | Admitting: Family Medicine

## 2022-09-27 ENCOUNTER — Other Ambulatory Visit: Payer: Self-pay | Admitting: Family Medicine

## 2022-10-01 DIAGNOSIS — H353221 Exudative age-related macular degeneration, left eye, with active choroidal neovascularization: Secondary | ICD-10-CM | POA: Diagnosis not present

## 2022-10-01 DIAGNOSIS — H43813 Vitreous degeneration, bilateral: Secondary | ICD-10-CM | POA: Diagnosis not present

## 2022-10-01 DIAGNOSIS — H35722 Serous detachment of retinal pigment epithelium, left eye: Secondary | ICD-10-CM | POA: Diagnosis not present

## 2022-10-01 DIAGNOSIS — H353112 Nonexudative age-related macular degeneration, right eye, intermediate dry stage: Secondary | ICD-10-CM | POA: Diagnosis not present

## 2022-10-16 ENCOUNTER — Encounter: Payer: Self-pay | Admitting: Family Medicine

## 2022-10-16 ENCOUNTER — Ambulatory Visit: Payer: BC Managed Care – PPO | Admitting: Family Medicine

## 2022-10-16 VITALS — BP 124/80 | HR 77 | Temp 97.8°F | Ht 72.0 in | Wt 210.0 lb

## 2022-10-16 DIAGNOSIS — I700 Atherosclerosis of aorta: Secondary | ICD-10-CM

## 2022-10-16 DIAGNOSIS — E785 Hyperlipidemia, unspecified: Secondary | ICD-10-CM

## 2022-10-16 DIAGNOSIS — R053 Chronic cough: Secondary | ICD-10-CM | POA: Diagnosis not present

## 2022-10-16 MED ORDER — DOXYCYCLINE HYCLATE 100 MG PO TABS: 100.0000 mg | ORAL_TABLET | Freq: Two times a day (BID) | ORAL | 0 refills | Status: AC

## 2022-10-16 NOTE — Patient Instructions (Addendum)
Cough/congestion/fatigue for 6-8 weeks with about 75% improvement in sinus symptoms on augmentin- his lungs are clear today- we discussed still could possibly have a walking pneumonia/atypical bacterial infection and that augmentin would not cover that -as a result we opted for doxycycline to cover atypical bacteria either for walking pneumonia or lingering sinus infection with post nasal drip  - we discussed chest x-ray and bloodwork but he would like to hold unless fails to resolve with antibiotic   Recommended follow up: Return for as needed for new, worsening, persistent symptoms. Seek care if drastic worsening of symptoms

## 2022-10-16 NOTE — Progress Notes (Signed)
Phone [phone] In person visit   Subjective:   Micheal May is a 67 y.o. year old very pleasant male patient who presents for/with See problem oriented charting Chief Complaint  Patient presents with   Cough    (No mask)Pt c/o lingering cough and fatigue that has lasted from his sinus infection 6-8 weeks ago and chest congestion has worsened with fatigue, got better after antibiotic but now its back.    Past Medical History-  Patient Active Problem List   Diagnosis Date Noted   Macular degeneration 03/27/2022    Priority: Medium    Aortic atherosclerosis ([place]) 09/24/2021    Priority: Medium    GAD (generalized anxiety disorder) 09/24/2021    Priority: Medium    Primary insomnia 09/24/2021    Priority: Medium    Small intestinal bacterial overgrowth (SIBO) ? 06/11/2021    Priority: Medium    History of adenomatous polyp of colon 09/24/2021    Priority: Low   Genital herpes 09/24/2021    Priority: Low   IBS (irritable bowel syndrome) 01/16/2021    Priority: Low   Primary osteoarthritis of right knee 02/06/2016    Priority: Low   Solitary pulmonary nodule 02/06/2016    Priority: Low   Chronic fatigue syndrome 11/26/2021   Facial abscess 11/26/2021   Shingles 11/26/2021   Testicle pain 05/20/2012    Medications- reviewed and updated Current Outpatient Medications  Medication Sig Dispense Refill   ascorbic acid (VITAMIN C) 1000 MG tablet Take 2,000 mg by mouth daily.     busPIRone (BUSPAR) 5 MG tablet Take 1 tablet (5 mg total) by mouth 3 (three) times daily. 270 tablet 3   Cholecalciferol (VITAMIN D3) 50 MCG (2000 UT) TABS Take 1 tablet by mouth daily.     doxycycline (VIBRA-TABS) 100 MG tablet Take 1 tablet (100 mg total) by mouth 2 (two) times daily for 10 days. 20 tablet 0   escitalopram (LEXAPRO) 20 MG tablet TAKE ONE TABLET BY MOUTH ONCE DAILY 90 tablet 3   eszopiclone (LUNESTA) 2 MG TABS tablet TAKE ONE TABLET BY MOUTH EVERYDAY AT BEDTIME AS NEEDED FOR  INSOMNIA 30 tablet 3   fluticasone (FLONASE) 50 MCG/ACT nasal spray Place 2 sprays into both nostrils daily. 16 g 6   loratadine (CLARITIN) 10 MG tablet Take 1 tablet (10 mg total) by mouth daily. 30 tablet 11   meloxicam (MOBIC) 15 MG tablet Take 15 mg by mouth daily.     Multiple Vitamins-Minerals (ICAPS AREDS 2 PO) Take 2 capsules by mouth daily.     Multiple Vitamins-Minerals (MULTIVITAMIN WITH MINERALS) tablet Take 1 tablet by mouth daily.     omeprazole (PRILOSEC) 20 MG capsule TAKE ONE CAPSULE BY MOUTH ONCE DAILY 90 capsule 3   Probiotic Product (PROBIOTIC DAILY PO) Take 1 tablet by mouth daily.     rosuvastatin (CRESTOR) 5 MG tablet Take 1 tablet (5 mg total) by mouth daily. 90 tablet 3   Saline (SIMPLY SALINE) 0.9 % AERS Place 2 each into the nose as directed. Use nightly for sinus hygiene long-term.  Can also be used as many times daily as desired to assist with clearing congested sinuses. 127 mL 11   sildenafil (VIAGRA) 100 MG tablet TAKE ONE TABLET BY MOUTH DAILY AS NEEDED 148 tablet 0   tobramycin (TOBREX) 0.3 % ophthalmic solution Place 1 drop into the left eye 4 (four) times daily.     valACYclovir (VALTREX) 500 MG tablet TAKE ONE TABLET BY MOUTH ONCE DAILY 90  tablet 3   No current facility-administered medications for this visit.     Objective:  BP 124/80   Pulse 77   Temp 97.8 F (36.6 C)   Ht 6' (1.829 m)   Wt 210 lb (95.3 kg)   SpO2 95%   BMI 28.48 kg/m  Gen: NAD, resting comfortably Tympanic membrane normal, erythematous nasal turbinates with clear discharge, no sinus tenderness, pharynx with evidence of post nasal drip  CV: RRR no murmurs rubs or gallops Lungs: CTAB no crackles, wheeze, rhonchi Ext: no edema Skin: warm, dry     Assessment and Plan   # cough/fatigue S:patient with sinus infection 6-8 weeks ago but has had lingering cough and fatigue. In fact feels chest congestion has worsened recently. Was treated with antibiotics on 09/02/22 with augmentin-  but started meds on 09/04/22 when worsening. Got 75% better but never completely resolved but sinus pressure and congestion is better. Some runny nose but minimal.   Today, He notes intermittent cough. Feels some general chest tightness- occasional shooting pains in chest or neck. No shortness of breath. No wheezing. Never smoked. No asthma or copid history. Has chronic fatigue in general but worsened since infection- a lot of work stress. No fevers. No exertional chest pain or shortness of breath - even with inteense yardwork which he has been able to still do A/P: Cough/congestion/fatigue for 6-8 weeks with about 75% improvement in sinus symptoms on augmentin- his lungs are clear today- we discussed still could possibly have a walking pneumonia/atypical bacterial infection and that augmentin would not cover that -as a result we opted for doxycycline to cover atypical bacteria either for walking pneumonia or lingering sinus infection with post nasal drip  - we discussed chest x-ray and bloodwork but he would like to hold unless fails to resolve with antibiotic   #hyperlipidemia #aortic atherosclerosis- incidental finding on ct abdomen/pelvis 2022 and ct chest 2019 S: Medication:rosuvastatin 5 mg started after below labs   Lab Results  Component Value Date   CHOL 210 (H) 03/27/2022   HDL 46.40 03/27/2022   LDLCALC 139 (H) 03/27/2022   TRIG 125.0 03/27/2022   CHOLHDL 5 03/27/2022   A/P: suspect lipids improving- prefer LDL under 70. I doubt his cough/fatigue/congestion/chest twinges of pain are related to cardiac disease but discussed if fails to improve could look at ct calcium scoring For aortic atherosclerosis (presumed stable)- LDL goal ideally <70 - will check lipids next set of labs  -on omeprazole for reflux  Recommended follow up: Return for as needed for new, worsening, persistent symptoms. Future Appointments  Date Time Provider [place]  03/30/2023  8:00 AM Marin Olp,  MD [facility]    Lab/Order associations:   ICD-10-CM   1. Chronic cough  R05.3     2. Aortic atherosclerosis (HCC)  I70.0     3. Hyperlipidemia, unspecified hyperlipidemia type  E78.5       Meds ordered this encounter  Medications   doxycycline (VIBRA-TABS) 100 MG tablet    Sig: Take 1 tablet (100 mg total) by mouth 2 (two) times daily for 10 days.    Dispense:  20 tablet    Refill:  0    Return precautions advised.  Garret Reddish, MD

## 2022-11-12 DIAGNOSIS — H353112 Nonexudative age-related macular degeneration, right eye, intermediate dry stage: Secondary | ICD-10-CM | POA: Diagnosis not present

## 2022-11-12 DIAGNOSIS — H43813 Vitreous degeneration, bilateral: Secondary | ICD-10-CM | POA: Diagnosis not present

## 2022-11-12 DIAGNOSIS — H353221 Exudative age-related macular degeneration, left eye, with active choroidal neovascularization: Secondary | ICD-10-CM | POA: Diagnosis not present

## 2022-12-25 DIAGNOSIS — M25552 Pain in left hip: Secondary | ICD-10-CM | POA: Diagnosis not present

## 2022-12-25 DIAGNOSIS — M1612 Unilateral primary osteoarthritis, left hip: Secondary | ICD-10-CM | POA: Diagnosis not present

## 2022-12-28 ENCOUNTER — Other Ambulatory Visit: Payer: Self-pay | Admitting: Family Medicine

## 2022-12-29 MED ORDER — ESZOPICLONE 2 MG PO TABS: ORAL_TABLET | ORAL | 5 refills | Status: DC

## 2023-01-07 DIAGNOSIS — H353221 Exudative age-related macular degeneration, left eye, with active choroidal neovascularization: Secondary | ICD-10-CM | POA: Diagnosis not present

## 2023-01-07 DIAGNOSIS — H43813 Vitreous degeneration, bilateral: Secondary | ICD-10-CM | POA: Diagnosis not present

## 2023-01-07 DIAGNOSIS — H353112 Nonexudative age-related macular degeneration, right eye, intermediate dry stage: Secondary | ICD-10-CM | POA: Diagnosis not present

## 2023-01-27 DIAGNOSIS — M25552 Pain in left hip: Secondary | ICD-10-CM | POA: Diagnosis not present

## 2023-02-27 ENCOUNTER — Other Ambulatory Visit: Payer: Self-pay | Admitting: Family Medicine

## 2023-03-11 ENCOUNTER — Ambulatory Visit: Payer: BC Managed Care – PPO | Admitting: Internal Medicine

## 2023-03-11 ENCOUNTER — Encounter (INDEPENDENT_AMBULATORY_CARE_PROVIDER_SITE_OTHER): Payer: Self-pay

## 2023-03-11 ENCOUNTER — Emergency Department (HOSPITAL_BASED_OUTPATIENT_CLINIC_OR_DEPARTMENT_OTHER)
Admission: EM | Admit: 2023-03-11 | Discharge: 2023-03-11 | Disposition: A | Discharge status: Home / Self Care | Payer: BC Managed Care – PPO | Attending: Emergency Medicine | Admitting: Emergency Medicine

## 2023-03-11 ENCOUNTER — Encounter (HOSPITAL_BASED_OUTPATIENT_CLINIC_OR_DEPARTMENT_OTHER): Payer: Self-pay | Admitting: Emergency Medicine

## 2023-03-11 ENCOUNTER — Encounter: Payer: Self-pay | Admitting: Internal Medicine

## 2023-03-11 ENCOUNTER — Other Ambulatory Visit: Payer: Self-pay

## 2023-03-11 ENCOUNTER — Emergency Department (HOSPITAL_BASED_OUTPATIENT_CLINIC_OR_DEPARTMENT_OTHER): Payer: BC Managed Care – PPO | Admitting: Radiology

## 2023-03-11 VITALS — BP 160/88 | HR 79 | Temp 98.0°F | Ht 72.0 in | Wt 210.2 lb

## 2023-03-11 DIAGNOSIS — R0600 Dyspnea, unspecified: Secondary | ICD-10-CM | POA: Insufficient documentation

## 2023-03-11 DIAGNOSIS — R631 Polydipsia: Secondary | ICD-10-CM

## 2023-03-11 DIAGNOSIS — Z20822 Contact with and (suspected) exposure to covid-19: Secondary | ICD-10-CM | POA: Insufficient documentation

## 2023-03-11 DIAGNOSIS — M791 Myalgia, unspecified site: Secondary | ICD-10-CM

## 2023-03-11 DIAGNOSIS — R0789 Other chest pain: Secondary | ICD-10-CM

## 2023-03-11 DIAGNOSIS — R5383 Other fatigue: Secondary | ICD-10-CM | POA: Diagnosis not present

## 2023-03-11 DIAGNOSIS — R0609 Other forms of dyspnea: Secondary | ICD-10-CM

## 2023-03-11 DIAGNOSIS — J929 Pleural plaque without asbestos: Secondary | ICD-10-CM | POA: Diagnosis not present

## 2023-03-11 DIAGNOSIS — R0602 Shortness of breath: Secondary | ICD-10-CM | POA: Diagnosis not present

## 2023-03-11 DIAGNOSIS — R079 Chest pain, unspecified: Secondary | ICD-10-CM | POA: Diagnosis not present

## 2023-03-11 DIAGNOSIS — R051 Acute cough: Secondary | ICD-10-CM | POA: Insufficient documentation

## 2023-03-11 DIAGNOSIS — I10 Essential (primary) hypertension: Secondary | ICD-10-CM

## 2023-03-11 DIAGNOSIS — R059 Cough, unspecified: Secondary | ICD-10-CM | POA: Diagnosis not present

## 2023-03-11 DIAGNOSIS — J9811 Atelectasis: Secondary | ICD-10-CM | POA: Diagnosis not present

## 2023-03-11 DIAGNOSIS — F419 Anxiety disorder, unspecified: Secondary | ICD-10-CM

## 2023-03-11 LAB — RESP PANEL BY RT-PCR (RSV, FLU A&B, COVID)  RVPGX2
Influenza A by PCR: NEGATIVE
Influenza B by PCR: NEGATIVE
Resp Syncytial Virus by PCR: NEGATIVE
SARS Coronavirus 2 by RT PCR: NEGATIVE

## 2023-03-11 LAB — CBC
HCT: 41.8 % (ref 39.0–52.0)
Hemoglobin: 14.8 g/dL (ref 13.0–17.0)
MCH: 31.4 pg (ref 26.0–34.0)
MCHC: 35.4 g/dL (ref 30.0–36.0)
MCV: 88.7 fL (ref 80.0–100.0)
Platelets: 183 10*3/uL (ref 150–400)
RBC: 4.71 MIL/uL (ref 4.22–5.81)
RDW: 12.7 % (ref 11.5–15.5)
WBC: 4.9 10*3/uL (ref 4.0–10.5)
nRBC: 0 % (ref 0.0–0.2)

## 2023-03-11 LAB — BASIC METABOLIC PANEL
Anion gap: 9 (ref 5–15)
BUN: 16 mg/dL (ref 8–23)
CO2: 25 mmol/L (ref 22–32)
Calcium: 9.1 mg/dL (ref 8.9–10.3)
Chloride: 104 mmol/L (ref 98–111)
Creatinine, Ser: 0.85 mg/dL (ref 0.61–1.24)
GFR, Estimated: >60 mL/min (ref >60)
Glucose, Bld: 108 mg/dL — ABNORMAL HIGH (ref 70–99)
Potassium: 4.2 mmol/L (ref 3.5–5.1)
Sodium: 138 mmol/L (ref 135–145)

## 2023-03-11 LAB — TROPONIN I (HIGH SENSITIVITY)
Troponin I (High Sensitivity): 4 ng/L (ref <18)
Troponin I (High Sensitivity): 3 ng/L (ref <18)

## 2023-03-11 MED ORDER — LISINOPRIL 10 MG PO TABS: 10.0000 mg | ORAL_TABLET | Freq: Every day | ORAL | 2 refills | Status: DC

## 2023-03-11 MED ORDER — ASPIRIN 81 MG PO CHEW
324.0000 mg | CHEWABLE_TABLET | Freq: Once | ORAL | Status: AC
  Administered 2023-03-11: 324 mg via ORAL

## 2023-03-11 MED ORDER — AMOXICILLIN 875 MG PO TABS: 875.0000 mg | ORAL_TABLET | Freq: Two times a day (BID) | ORAL | 0 refills | Status: DC

## 2023-03-11 NOTE — Patient Instructions (Signed)
It was a pleasure seeing you today! Your health and satisfaction are our top priorities.   Glenetta Hew, MD VISIT SUMMARY:  During your visit, we discussed your recent symptoms of fatigue, chest pressure, cough, increased thirst, and anxiety. You also reported elevated blood pressure readings at home. We reviewed your chronic conditions, including pulmonary nodules, IBS, small intestinal bacterial overgrowth, aortic atherosclerosis, generalized anxiety disorder, primary insomnia, chronic fatigue syndrome, and macular degeneration, and found no recent changes or acute issues related to these conditions.  YOUR PLAN:  -UNDIFFERENTIATED SYNDROME: Your symptoms could be due to a variety of conditions, including COVID-19, an increase in your chronic fatigue syndrome, anxiety, heart disease, pneumonia, side effects from your medications, dehydration, or a thyroid disorder. To better understand what's causing your symptoms, we're referring you to the emergency room for a comprehensive evaluation. This will include tests for your heart, lungs, and other potential causes.  -HYPERTENSION: Your blood pressure readings at home have been high. We will continue with your current treatment plan and monitor your blood pressure closely.  -CHRONIC CONDITIONS: You have several ongoing health conditions. We will continue to manage these as per your individual treatment plans, and there are no new changes or issues related to these conditions at this time.  INSTRUCTIONS:  Please go to the emergency room for a comprehensive evaluation. This will help [place] understand what's causing your symptoms. While waiting for the evaluation, consider taking an aspirin. Continue to monitor your blood pressure at home and follow your current treatment plans for your chronic conditions. Next Steps:  [x]  Flexible Follow-Up: We recommend a follow up with your primary care, a specialist, or me within 1-2 weeks for ensuring your problem  resolves. This allows for progress monitoring and treatment adjustments. [x]  Early Intervention: Schedule sooner appointment, call our on-call services, or go to emergency room if there is Increase in pain or discomfort New or worsening symptoms Sudden or severe changes in your health [x]  Lab & X-ray Appointments: complete or schedule to complete today, or call to schedule.  X-rays: [facility] (M-F, 8:30am-noon or 1pm-5pm).  Making the Most of Our Focused (20 minute) Follow Up Appointments:  [x]   Clearly state your top concerns at the beginning of the visit to focus our discussion [x]   If you anticipate you will need more time, please inform the front desk during scheduling - we can book multiple appointments in the same week. [x]   If you have transportation problems- use our convenient video appointments or ask about transportation support. [x]   We can get down to business faster if you use MyChart to update information before the visit and submit non-urgent questions before your visit. Thank you for taking the time to provide details through MyChart.  Let our nurse know and she can import this information into your encounter documents.  Arrival and Wait Times: [x]   Arriving on time ensures that everyone receives prompt attention. [x]   Early morning (8a) and afternoon (1p) appointments tend to have shortest wait times. [x]   Unfortunately, we cannot delay appointments for late arrivals or hold slots during phone calls.  Getting Answers and Following Up  [x]   Simple Questions & Concerns: For quick questions or basic follow-up after your visit, reach [place] at [phone] or MyChart messaging. [x]   Complex Concerns: If your concern is more complex, scheduling an appointment might be best. Discuss this with the staff to find the most suitable option. [x]   Lab & Imaging Results: We'll contact  you directly if results are abnormal or you don't use MyChart. Most normal results will  be on MyChart within 2-3 business days, with a review message from Dr. Jon Billings. Haven't heard back in 2 weeks? Need results sooner? Contact us at [phone]. [x]   Referrals: Our referral coordinator will manage specialist referrals. The specialist's office should contact you within 2 weeks to schedule an appointment. Call us if you haven't heard from them after 2 weeks.  Staying Connected  [x]   MyChart: Activate your MyChart for the fastest way to access results and message [place]. See the last page of this paperwork for instructions on how to activate.  Bring to Your Next Appointment  [x]   Medications: Please bring all your medication bottles to your next appointment to ensure we have an accurate record of your prescriptions. [x]   Health Diaries: If you're monitoring any health conditions at home, keeping a diary of your readings can be very helpful for discussions at your next appointment.  Billing  [x]   X-ray & Lab Orders: These are billed by separate companies. Contact the invoicing company directly for questions or concerns. [x]   Visit Charges: Discuss any billing inquiries with our administrative services team.  Your Satisfaction Matters  [x]   Share Your Experience: We strive for your satisfaction! If you have any complaints, or preferably compliments, please let Dr. Jon Billings know directly or contact our Practice Administrators, Edwena Felty or [employer], by asking at the front desk.   Reviewing Your Records  [x]   Review this early draft of your clinical encounter notes below and the final encounter summary tomorrow on MyChart after its been completed.   Other fatigue  Chest pressure -     Aspirin  Acute cough  Myalgia  Primary hypertension  Polydipsia  Anxiety

## 2023-03-11 NOTE — ED Triage Notes (Signed)
Pt via pov from pcp office; states he "has felt rundown" for a couple of weeks; has had some coughing, and some chest pressure. Pt states the pcp office "ruled out" covid but he did not have a test. Pt alert & oriented, nad noted.

## 2023-03-11 NOTE — ED Notes (Signed)
Pt verbalized understanding of d/c instructions, meds, and followup care. Denies questions. VSS, no distress noted. Steady gait to exit with all belongings.  ?

## 2023-03-11 NOTE — Progress Notes (Signed)
[facility]: [phone]   Routine Medical Office Visit  Patient:  Micheal May      Age: 67 y.o.       Sex:  male  Date:   03/11/2023 Patient Care Team: Shelva Majestic, MD as PCP - General (Family Medicine) Today's Healthcare Provider: Lula Olszewski, MD   Assessment and Plan:   Shanta was seen today for chest congestion, fatigue and hypertension.  Chest pressure -     Aspirin  Other fatigue  Acute cough  Myalgia  Primary hypertension  Polydipsia  Anxiety  DOE (dyspnea on exertion)  We reviewed the analysis below together, but given his risk factors of age/gender/hypertension/chest pressure/dyspnea on exertion, I felt he needs acute coronary syndrome rule out at emergency room- so he agreed to drive self to nearby emergency room at [facility] for this purpose.    I gave him aspirin x 324 mg to chew and he took it for now until he can be evaluated further at emergency room.  He looked stable to drive himself the few minutes, these symptom(s) don't seem to be worsening at the moment.  Given the patient's presenting symptoms and medical history, here's a differential diagnosis and plan to confirm or rule out various possibilities:  Differential Diagnosis:  1. COVID-19 or other viral respiratory infection 2. Acute exacerbation of chronic fatigue syndrome 3. Anxiety-related symptoms (considering GAD history) 4. Acute coronary syndrome (given age, hypertension, and chest pressure) 5. Community-acquired pneumonia 6. Exacerbation of COPD (considering smoking history implied by pulmonary nodule) 7. Medication side effects 8. Dehydration (given polydipsia) 9. Thyroid dysfunction 10. Chronic kidney disease  Plan:  1. Detailed history and physical examination:    - Focus on onset and progression of symptoms    - Assess for fever, shortness of breath, and any GI symptoms    - Evaluate hydration status  2. Vital signs:    - Temperature, blood  pressure, heart rate, respiratory rate, oxygen saturation  3. Laboratory tests:    - Complete blood count (CBC)    - Comprehensive metabolic panel (CMP)    - Thyroid function tests (TSH, free T4)    - C-reactive protein (CRP) and erythrocyte sedimentation rate (ESR)    - COVID-19 PCR test    - Influenza A/B rapid test    - Urinalysis  4. Cardiac workup:    - 12-lead ECG    - Troponin levels    - Consider stress test or cardiac imaging if suspicion for ACS is high  5. Chest imaging:    - Chest X-ray    - Consider chest CT if X-ray is inconclusive (also to reassess pulmonary nodule)  6. Pulmonary function tests:    - Spirometry to assess for COPD  7. Medication review:    - Evaluate for potential side effects or interactions  8. Mental health assessment:    - Screen for exacerbation of anxiety or depression  9. Specialist consultations:    - Cardiology if cardiac etiology is suspected    - Pulmonology if respiratory cause is likely    - Psychiatry for anxiety management  10. Follow-up:     - Schedule a follow-up appointment to review test results and reassess symptoms     - Consider home monitoring of blood pressure and pulse oximetry  This plan aims to systematically evaluate the patient's symptoms while considering their complex medical history. The approach focuses on ruling out serious conditions like COVID-19 and acute coronary syndrome while  also addressing chronic issues that may be exacerbated. Adjustments to the plan may be necessary based on initial findings and the patient's response to any immediate interventions.   Recommended follow up: Return in about 1 week (around 03/18/2023) for close follow up acute illness.  Future Appointments  Date Time Provider Department Center  03/30/2023  8:00 AM Shelva Majestic, MD [facility]           Clinical Presentation:    67 y.o. male who has Primary osteoarthritis of right knee; Solitary pulmonary nodule; IBS  (irritable bowel syndrome); Small intestinal bacterial overgrowth (SIBO) ?; Aortic atherosclerosis (HCC); GAD (generalized anxiety disorder); Primary insomnia; History of adenomatous polyp of colon; Genital herpes; Chronic fatigue syndrome; Facial abscess; Shingles; Testicle pain; and Macular degeneration on their problem list. His reasons/main concerns/chief complaints for today's office visit are Chest congestion, Fatigue, and Hypertension (Elevated BP for the last few days.)   AI-Extracted: Discussed the use of AI scribe software for clinical note transcription with the patient, who gave verbal consent to proceed.  History of Present Illness   The patient, a 67 year old individual with a history of pulmonary nodules, IBS, small intestinal bacterial overgrowth, aortic atherosclerosis, generalized anxiety disorder, primary insomnia, chronic fatigue syndrome, and macular degeneration, presents with a one-week history of feeling "off." He reports a sensation of chest heaviness, a dry cough, and increased fatigue. The patient also notes increased thirst and anxiety, attributing the latter to current stressors at work and home. He denies any rash, chest pain, or fevers.  The patient describes the onset of symptoms as gradual, beginning approximately a week ago with a feeling of being rundown. Despite this, he continued his usual activities until the symptoms intensified over the last few days. Initially, the cough was described as "loose," but it has since become "tight." The patient also reports some shortness of breath with exertion, which he attributes to his physically demanding job that involves a significant amount of walking.  In addition to these symptoms, the patient reports generalized body aches, which he describes as not significantly more than usual. He denies any recent changes in his medication regimen, which includes vitamin D, buspirone, Lexapro, and esoplacone. The patient denies any history  of smoking.      He  has a past medical history of Anxiety, Arthritis, GERD (gastroesophageal reflux disease), Grover's disease, IBS (irritable bowel syndrome), MRSA (methicillin resistant staph aureus) culture positive, PONV (postoperative nausea and vomiting), and Tubular adenoma (01/28/2021). Current Outpatient Medications on File Prior to Visit  Medication Sig   ascorbic acid (VITAMIN C) 1000 MG tablet Take 2,000 mg by mouth daily.   busPIRone (BUSPAR) 5 MG tablet Take 1 tablet (5 mg total) by mouth 3 (three) times daily.   Cholecalciferol (VITAMIN D3) 50 MCG (2000 UT) TABS Take 1 tablet by mouth daily.   escitalopram (LEXAPRO) 20 MG tablet TAKE ONE TABLET BY MOUTH ONCE DAILY   eszopiclone (LUNESTA) 2 MG TABS tablet TAKE ONE TABLET BY MOUTH EVERYDAY AT BEDTIME AS NEEDED FOR INSOMNIA   fluticasone (FLONASE) 50 MCG/ACT nasal spray Place 2 sprays into both nostrils daily.   loratadine (CLARITIN) 10 MG tablet Take 1 tablet (10 mg total) by mouth daily.   meloxicam (MOBIC) 15 MG tablet Take 15 mg by mouth daily.   Multiple Vitamins-Minerals (ICAPS AREDS 2 PO) Take 2 capsules by mouth daily.   Multiple Vitamins-Minerals (MULTIVITAMIN WITH MINERALS) tablet Take 1 tablet by mouth daily.   omeprazole (PRILOSEC) 20 MG capsule TAKE  ONE CAPSULE BY MOUTH ONCE DAILY   Probiotic Product (PROBIOTIC DAILY PO) Take 1 tablet by mouth daily.   rosuvastatin (CRESTOR) 5 MG tablet Take 1 tablet (5 mg total) by mouth daily.   Saline (SIMPLY SALINE) 0.9 % AERS Place 2 each into the nose as directed. Use nightly for sinus hygiene long-term.  Can also be used as many times daily as desired to assist with clearing congested sinuses.   sildenafil (VIAGRA) 100 MG tablet TAKE ONE TABLET BY MOUTH ONCE DAILY AS NEEDED   tobramycin (TOBREX) 0.3 % ophthalmic solution Place 1 drop into the left eye 4 (four) times daily.   valACYclovir (VALTREX) 500 MG tablet TAKE ONE TABLET BY MOUTH ONCE DAILY   No current  facility-administered medications on file prior to visit.   There are no discontinued medications.       Clinical Data Analysis:   Physical Exam  BP (!) 160/88 (BP Location: Left Arm, Patient Position: Sitting)   Pulse 79   Temp 98 F (36.7 C) (Temporal)   Ht 6' (1.829 m)   Wt 210 lb 3.2 oz (95.3 kg)   SpO2 96%   BMI 28.51 kg/m  Wt Readings from Last 10 Encounters:  03/11/23 210 lb 3.2 oz (95.3 kg)  10/16/22 210 lb (95.3 kg)  09/02/22 203 lb 12.8 oz (92.4 kg)  03/27/22 205 lb 9.6 oz (93.3 kg)  11/26/21 208 lb 2 oz (94.4 kg)  09/24/21 209 lb (94.8 kg)  04/24/21 209 lb (94.8 kg)  01/28/21 208 lb (94.3 kg)  01/16/21 208 lb 8 oz (94.6 kg)  02/23/18 205 lb (93 kg)   Vital signs reviewed.  Nursing notes reviewed. Weight trend reviewed. Abnormalities and Problem-Specific physical exam findings:  heart s1 and s2 heart sounds have regular rate and rhythm , no murmurs, rubs, or gallops- clear to auscultation bilaterally, no wheezes rhonci rales. Normal work of breathing.appears anxious and tired.  General Appearance:  No acute distress appreciable.   Well-groomed, healthy-appearing male.  Well proportioned with no abnormal fat distribution.  Good muscle tone. Skin: Clear and well-hydrated. Pulmonary:  Normal work of breathing at rest, no respiratory distress apparent. SpO2: 96 %  Musculoskeletal: All extremities are intact.  Neurological:  Awake, alert, oriented, and engaged.  No obvious focal neurological deficits or cognitive impairments.  Sensorium seems unclouded.   Speech is clear and coherent with logical content. Psychiatric:  Appropriate mood, pleasant and cooperative demeanor, thoughtful and engaged during the exam  Results Reviewed:      No results found for any visits on 03/11/23.  Office Visit on 03/27/2022  Component Date Value   HIV 1&2 Ab, 4th Generati* 03/27/2022 NON-REACTIVE    Hepatitis C Ab 03/27/2022 NON-REACTIVE    WBC 03/27/2022 5.1    RBC 03/27/2022 4.80     Hemoglobin 03/27/2022 15.0    HCT 03/27/2022 43.4    MCV 03/27/2022 90.5    MCHC 03/27/2022 34.5    RDW 03/27/2022 13.5    Platelets 03/27/2022 191.0    Neutrophils Relative % 03/27/2022 70.3    Lymphocytes Relative 03/27/2022 14.7    Monocytes Relative 03/27/2022 10.9    Eosinophils Relative 03/27/2022 3.5    Basophils Relative 03/27/2022 0.6    Neutro Abs 03/27/2022 3.6    Lymphs Abs 03/27/2022 0.7    Monocytes Absolute 03/27/2022 0.6    Eosinophils Absolute 03/27/2022 0.2    Basophils Absolute 03/27/2022 0.0    Sodium 03/27/2022 138    Potassium 03/27/2022 4.2  Chloride 03/27/2022 102    CO2 03/27/2022 28    Glucose, Bld 03/27/2022 97    BUN 03/27/2022 18    Creatinine, Ser 03/27/2022 0.80    Total Bilirubin 03/27/2022 1.0    Alkaline Phosphatase 03/27/2022 95    AST 03/27/2022 17    ALT 03/27/2022 23    Total Protein 03/27/2022 6.9    Albumin 03/27/2022 4.6    GFR 03/27/2022 93.98    Calcium 03/27/2022 9.2    Cholesterol 03/27/2022 210 (H)    Triglycerides 03/27/2022 125.0    HDL 03/27/2022 46.40    VLDL 03/27/2022 25.0    LDL Cholesterol 03/27/2022 139 (H)    Total CHOL/HDL Ratio 03/27/2022 5    NonHDL 03/27/2022 163.80    PSA 03/27/2022 0.85    Vitamin B-12 03/27/2022 733    No image results found.   No results found.  CT Abdomen Pelvis W Contrast  Result Date: 02/19/2021 CLINICAL DATA:  Abdominal pain and bloating over the last 8 months. EXAM: CT ABDOMEN AND PELVIS WITH CONTRAST TECHNIQUE: Multidetector CT imaging of the abdomen and pelvis was performed using the standard protocol following bolus administration of intravenous contrast. CONTRAST:  ISOVUE-300 IOPAMIDOL (ISOVUE-300) INJECTION 61% COMPARISON:  09/11/2017 FINDINGS: Lower chest: Minimal lingular scarring. Hepatobiliary: 1.0 by 0.8 cm hypodense lesion in the caudate lobe on image 21 of series 2 is probably a small cyst. Otherwise unremarkable. Pancreas: Unremarkable Spleen: Punctate  calcifications in the anterior spleen similar to prior, probably from old granulomatous disease. Adrenals/Urinary Tract: Stable appearance of left extrarenal pelvis. 0.7 cm exophytic hypodense lesion of the right kidney upper pole laterally on image 18 series 11 is not changed from prior and probably a tiny cyst but technically nonspecific due to small size. Adrenal glands appear unremarkable. Stomach/Bowel: Mildly redundant sigmoid colon. Prior appendectomy. No dilated bowel. Vascular/Lymphatic: Aortoiliac atherosclerotic vascular disease. Reproductive: Unremarkable Other: No supplemental non-categorized findings. Musculoskeletal: Multilevel degenerative disc disease with borderline impingement at several levels. IMPRESSION: 1. A specific cause for the patient's abdominal pain and bloating is not identified. 2. Other imaging findings of potential clinical significance: Small hypodense lesion in the caudate lobe of the liver is probably a cyst. Aortic Atherosclerosis (ICD10-I70.0). Degenerative disc disease. Electronically Signed   By: Gaylyn Rong M.D.   On: 02/19/2021 16:16      This encounter employed real-time, collaborative documentation. The patient actively reviewed and updated their medical record on a shared screen, ensuring transparency and facilitating joint problem-solving for the problem list, overview, and plan. This approach promotes accurate, informed care. The treatment plan was discussed and reviewed in detail, including medication safety, potential side effects, and all patient questions. We confirmed understanding and comfort with the plan. Follow-up instructions were established, including contacting the office for any concerns, returning if symptoms worsen, persist, or new symptoms develop, and precautions for potential emergency department visits. ----------------------------------------------------- Lula Olszewski, MD  03/11/2023 1:37 PM  [facility]:  [phone]

## 2023-03-11 NOTE — ED Provider Notes (Signed)
[place] EMERGENCY DEPARTMENT AT [facility] Provider Note   CSN: [id number] Arrival date & time: 03/11/23  1345     History  Chief Complaint  Patient presents with   Chest Pain    Micheal May is a 67 y.o. male.   Chest Pain   67 year old male presents emergency department with complaints of cough, shortness of breath, chest pain.  Patient states he has been with cough as well as some mild symptoms of shortness of breath for the past week.  Describes cough is slightly productive.  Reports some shortness of breath with exertion.  Reports over the past 3 days, has been having some left-sided chest pressure that has been "random in nature" and only lasting a matter of a few seconds before spontaneous resolution.  Denies any known exacerbating or relieving factors.  States that chest pain has been nonexertional in nature.  Denies history of similar symptoms in the past.  Went to primary care who told to come to the emergency department for further assessment.  Patient also states that over the past 3 days, has had increased blood pressure readings at home in the 140s and 150s systolic.  Denies any fever, chills, abdominal pain, nausea, vomiting, urinary symptoms, change in bowel habits.  Past medical history significant for IBS, GERD, chronic fatigue syndrome, pulmonary nodule  Home Medications Prior to Admission medications   Medication Sig Start Date End Date Taking? Authorizing Provider  amoxicillin (AMOXIL) 875 MG tablet Take 1 tablet (875 mg total) by mouth 2 (two) times daily. 03/11/23  Yes Sherian Maroon A, PA  lisinopril (ZESTRIL) 10 MG tablet Take 1 tablet (10 mg total) by mouth daily. 03/11/23  Yes Sherian Maroon A, PA  ascorbic acid (VITAMIN C) 1000 MG tablet Take 2,000 mg by mouth daily.    [provider]  busPIRone (BUSPAR) 5 MG tablet Take 1 tablet (5 mg total) by mouth 3 (three) times daily. 03/27/22   Shelva Majestic, MD  Cholecalciferol (VITAMIN D3)  50 MCG (2000 UT) TABS Take 1 tablet by mouth daily.    [provider]  escitalopram (LEXAPRO) 20 MG tablet TAKE ONE TABLET BY MOUTH ONCE DAILY 09/30/22   Shelva Majestic, MD  eszopiclone (LUNESTA) 2 MG TABS tablet TAKE ONE TABLET BY MOUTH EVERYDAY AT BEDTIME AS NEEDED FOR INSOMNIA 12/29/22   Shelva Majestic, MD  fluticasone [facility]) 50 MCG/ACT nasal spray Place 2 sprays into both nostrils daily. 09/02/22   Lula Olszewski, MD  loratadine (CLARITIN) 10 MG tablet Take 1 tablet (10 mg total) by mouth daily. 09/02/22   Lula Olszewski, MD  meloxicam (MOBIC) 15 MG tablet Take 15 mg by mouth daily. 08/26/22   [provider]  Multiple Vitamins-Minerals (ICAPS AREDS 2 PO) Take 2 capsules by mouth daily.    [provider]  Multiple Vitamins-Minerals (MULTIVITAMIN WITH MINERALS) tablet Take 1 tablet by mouth daily.    [provider]  omeprazole (PRILOSEC) 20 MG capsule TAKE ONE CAPSULE BY MOUTH ONCE DAILY 09/30/22   Shelva Majestic, MD  Probiotic Product (PROBIOTIC DAILY PO) Take 1 tablet by mouth daily.    [provider]  rosuvastatin (CRESTOR) 5 MG tablet Take 1 tablet (5 mg total) by mouth daily. 03/28/22   Shelva Majestic, MD  Saline (SIMPLY SALINE) 0.9 % AERS Place 2 each into the nose as directed. Use nightly for sinus hygiene long-term.  Can also be used as many times daily as desired to assist  with clearing congested sinuses. 09/02/22   Lula Olszewski, MD  sildenafil (VIAGRA) 100 MG tablet TAKE ONE TABLET BY MOUTH ONCE DAILY AS NEEDED 02/27/23   Shelva Majestic, MD  tobramycin (TOBREX) 0.3 % ophthalmic solution Place 1 drop into the left eye 4 (four) times daily. 04/11/22   [provider]  valACYclovir (VALTREX) 500 MG tablet TAKE ONE TABLET BY MOUTH ONCE DAILY 09/08/22   Shelva Majestic, MD      Allergies    Patient has no known allergies.    Review of Systems   Review of Systems  Cardiovascular:  Positive for chest pain.  All  other systems reviewed and are negative.   Physical Exam Updated Vital Signs BP (!) 149/102   Pulse 69   Temp 98.2 F (36.8 C)   Resp 19   Ht 6' (1.829 m)   Wt 93 kg   SpO2 96%   BMI 27.80 kg/m  Physical Exam Vitals and nursing note reviewed.  Constitutional:      General: He is not in acute distress.    Appearance: He is well-developed.  HENT:     Head: Normocephalic and atraumatic.  Eyes:     Conjunctiva/sclera: Conjunctivae normal.  Cardiovascular:     Rate and Rhythm: Normal rate and regular rhythm.     Heart sounds: No murmur heard. Pulmonary:     Effort: Pulmonary effort is normal. No respiratory distress.     Breath sounds: Normal breath sounds. No wheezing, rhonchi or rales.  Abdominal:     Palpations: Abdomen is soft.     Tenderness: There is no abdominal tenderness. There is no guarding.  Musculoskeletal:        General: No swelling.     Cervical back: Neck supple.     Right lower leg: No edema.     Left lower leg: No edema.  Skin:    General: Skin is warm and dry.     Capillary Refill: Capillary refill takes less than 2 seconds.  Neurological:     Mental Status: He is alert.  Psychiatric:        Mood and Affect: Mood normal.     ED Results / Procedures / Treatments   Labs (all labs ordered are listed, but only abnormal results are displayed) Labs Reviewed  BASIC METABOLIC PANEL - Abnormal; Notable for the following components:      Result Value   Glucose, Bld 108 (*)    All other components within normal limits  RESP PANEL BY RT-PCR (RSV, FLU A&B, COVID)  [id number]  CBC  TROPONIN I (HIGH SENSITIVITY)  TROPONIN I (HIGH SENSITIVITY)    EKG EKG Interpretation Date/Time:  Wednesday March 11 2023 13:54:26 EDT Ventricular Rate:  72 PR Interval:  168 QRS Duration:  142 QT Interval:  438 QTC Calculation: 479 R Axis:   51  Text Interpretation: Normal sinus rhythm Right bundle branch block Abnormal ECG When compared with ECG of 28-Jan-2016 09:38,  Right bundle branch block has replaced Incomplete right bundle branch block Confirmed by Fulton Reek [phone]) on 03/11/2023 2:02:48 PM  Radiology DG Chest 2 View  Result Date: 03/11/2023 CLINICAL DATA:  Chest pain EXAM: CHEST - 2 VIEW COMPARISON:  X-ray 01/28/2016.  CT 02/23/2018 FINDINGS: Minimal linear opacity left lung base likely scar or atelectasis. No consolidation, pneumothorax or effusion. No edema. Eventration of the diaphragm. Normal cardiopericardial silhouette. Apical pleural thickening. Degenerative changes of the spine. Stable retrosternal nodule. Please correlate with the prior  CT and PET workup. IMPRESSION: Mild left basilar atelectasis.  No acute cardiopulmonary disease. Electronically Signed   By: Karen Kays M.D.   On: 03/11/2023 15:05    Procedures Procedures    Medications Ordered in ED Medications - No data to display  ED Course/ Medical Decision Making/ A&P                                 Medical Decision Making Amount and/or Complexity of Data Reviewed Labs: ordered. Radiology: ordered.  Risk Prescription drug management.   This patient presents to the ED for concern of chest pain, this involves an extensive number of treatment options, and is a complaint that carries with it a high risk of complications and morbidity.  The differential diagnosis includes The causes for shortness of breath include but are not limited to Cardiac (AHF, pericardial effusion and tamponade, arrhythmias, ischemia, etc) Respiratory (COPD, asthma, pneumonia, pneumothorax, primary pulmonary hypertension, PE/VQ mismatch) Hematological (anemia)  Co morbidities that complicate the patient evaluation  See HPI   Additional history obtained:  Additional history obtained from EMR External records from outside source obtained and reviewed including hospital records   Lab Tests:  I Ordered, and personally interpreted labs.  The pertinent results include: No leukocytosis.  No  evidence of anemia.  Placed within range.  No Electra abnormalities.  No renal dysfunction.  Respiratory viral panel negative.  Initial troponin of 4 with repeat 3   Imaging Studies ordered:  I ordered imaging studies including chest x-ray I independently visualized and interpreted imaging which showed mild left basilar ectasis.  No other acute cardiopulmonary abnormalities I agree with the radiologist interpretation   Cardiac Monitoring: / EKG:  The patient was maintained on a cardiac monitor.  I personally viewed and interpreted the cardiac monitored which showed an underlying rhythm of: Normal sinus rhythm.  Right bundle much block.  Consultations Obtained:  N/a   Problem List / ED Course / Critical interventions / Medication management  Cough, shortness of breath, chest pain Reevaluation of the patient showed that the patient stayed the same I have reviewed the patients home medicines and have made adjustments as needed   Social Determinants of Health:  Denies tobacco, illicit drug use   Test / Admission - Considered:  Cough, shortness of breath, chest pain Vitals signs significant for hypertension with blood pressure 149/102. Otherwise within normal range and stable throughout visit. Laboratory/imaging studies significant for: See above 67 year old male presents emergency department with complaints of 1 week of cough, shortness of breath as well as a 3 days of intermittent left-sided chest pain.  Regarding cough and shortness of breath, seem to be associated with worsening cough over the past week or so.  Shortness of breath with exertion but not experienced otherwise.  Regarding patient's left-sided chest discomfort, seem to be rather random in nature without any known exacerbating relieving factors.  Pain not exertionally exacerbated.  Patient with delta negative troponin with development of right bundle branch block from incomplete right bundle branch block from EKG  performed in 2017.  Patient with heart score of 0-3.  Patient without tachycardia, pleuritic chest pain, no risk factors for DVT/PE with low Pesi score; doubt PE.  Patient without pulse deficit, neurologic deficits, chest pain with radiation to back significant hypertension; low suspicion for aortic dissection.  No evidence of volume overload without evidence of pulmonary vascular congestion, pleural effusion on chest x-ray; low  suspicion for CHF.  Patient was with some left-sided basilar atelectasis.  Discussion was had with patient regarding empiric treatment for possible developing pneumonia of which patient elected for.  Will also begin medication for treatment of hypertension given patient's persistently elevated blood pressure in the upper 140s/150s while in the ED.  Will recommend close follow-up with primary care/cardiology in the outpatient setting for further evaluation/evaluation of patient's symptoms.  Treatment plan discussed at length with patient and he acknowledged understand was agreeable to said plan.  Patient overall well-appearing, febrile no acute distress. Worrisome signs and symptoms were discussed with the patient, and the patient acknowledged understanding to return to the ED if noticed. Patient was stable upon discharge.          Final Clinical Impression(s) / ED Diagnoses Final diagnoses:  Acute cough  Dyspnea, unspecified type    Rx / DC Orders ED Discharge Orders          Ordered    lisinopril (ZESTRIL) 10 MG tablet  Daily        03/11/23 1655    amoxicillin (AMOXIL) 875 MG tablet  2 times daily        03/11/23 1655              Peter Garter, [place] 03/11/23 1705    Laurence Spates, MD 03/12/23 [phone]

## 2023-03-11 NOTE — Discharge Instructions (Addendum)
As discussed, your workup today was overall reassuring.  Does not appear like you are having a heart attack as both of your heart enzymes were normal.  You did have x-ray evidence of possibly developing pneumonia so we will treat this with antibiotics in the form of amoxicillin in the outpatient setting.  Regarding her blood pressure, it has been elevated while in the emergency department so we will begin medication in the form of lisinopril.  See information attached regarding medication information.  Recommend follow-up with primary care for further assessment/evaluation of your symptoms.  Please do not hesitate to return to emergency department if the worrisome signs and symptoms we discussed become apparent.

## 2023-03-18 DIAGNOSIS — H2513 Age-related nuclear cataract, bilateral: Secondary | ICD-10-CM | POA: Diagnosis not present

## 2023-03-18 DIAGNOSIS — H353112 Nonexudative age-related macular degeneration, right eye, intermediate dry stage: Secondary | ICD-10-CM | POA: Diagnosis not present

## 2023-03-18 DIAGNOSIS — H43813 Vitreous degeneration, bilateral: Secondary | ICD-10-CM | POA: Diagnosis not present

## 2023-03-18 DIAGNOSIS — H353221 Exudative age-related macular degeneration, left eye, with active choroidal neovascularization: Secondary | ICD-10-CM | POA: Diagnosis not present

## 2023-03-19 ENCOUNTER — Other Ambulatory Visit: Payer: Self-pay | Admitting: Family Medicine

## 2023-03-30 ENCOUNTER — Ambulatory Visit (INDEPENDENT_AMBULATORY_CARE_PROVIDER_SITE_OTHER): Payer: BC Managed Care – PPO | Admitting: Family Medicine

## 2023-03-30 ENCOUNTER — Encounter: Payer: Self-pay | Admitting: Family Medicine

## 2023-03-30 VITALS — BP 128/74 | HR 69 | Temp 98.1°F | Ht 72.0 in | Wt 210.8 lb

## 2023-03-30 DIAGNOSIS — I10 Essential (primary) hypertension: Secondary | ICD-10-CM

## 2023-03-30 DIAGNOSIS — Z0000 Encounter for general adult medical examination without abnormal findings: Secondary | ICD-10-CM | POA: Diagnosis not present

## 2023-03-30 DIAGNOSIS — E785 Hyperlipidemia, unspecified: Secondary | ICD-10-CM | POA: Diagnosis not present

## 2023-03-30 DIAGNOSIS — I700 Atherosclerosis of aorta: Secondary | ICD-10-CM

## 2023-03-30 DIAGNOSIS — Z125 Encounter for screening for malignant neoplasm of prostate: Secondary | ICD-10-CM | POA: Diagnosis not present

## 2023-03-30 DIAGNOSIS — F411 Generalized anxiety disorder: Secondary | ICD-10-CM

## 2023-03-30 LAB — COMPREHENSIVE METABOLIC PANEL
ALT: 27 U/L (ref 0–53)
AST: 19 U/L (ref 0–37)
Albumin: 4.4 g/dL (ref 3.5–5.2)
Alkaline Phosphatase: 95 U/L (ref 39–117)
BUN: 21 mg/dL (ref 6–23)
CO2: 25 mEq/L (ref 19–32)
Calcium: 9.1 mg/dL (ref 8.4–10.5)
Chloride: 105 mEq/L (ref 96–112)
Creatinine, Ser: 0.83 mg/dL (ref 0.40–1.50)
GFR: 92.29 mL/min (ref >60.00)
Glucose, Bld: 108 mg/dL — ABNORMAL HIGH (ref 70–99)
Potassium: 3.9 mEq/L (ref 3.5–5.1)
Sodium: 142 mEq/L (ref 135–145)
Total Bilirubin: 1.3 mg/dL — ABNORMAL HIGH (ref 0.2–1.2)
Total Protein: 6.7 g/dL (ref 6.0–8.3)

## 2023-03-30 LAB — CBC WITH DIFFERENTIAL/PLATELET
Basophils Absolute: 0 10*3/uL (ref 0.0–0.1)
Basophils Relative: 0.6 % (ref 0.0–3.0)
Eosinophils Absolute: 0.2 10*3/uL (ref 0.0–0.7)
Eosinophils Relative: 4.8 % (ref 0.0–5.0)
HCT: 40.8 % (ref 39.0–52.0)
Hemoglobin: 14.3 g/dL (ref 13.0–17.0)
Lymphocytes Relative: 14 % (ref 12.0–46.0)
Lymphs Abs: 0.6 10*3/uL — ABNORMAL LOW (ref 0.7–4.0)
MCHC: 35.1 g/dL (ref 30.0–36.0)
MCV: 94.6 fl (ref 78.0–100.0)
Monocytes Absolute: 0.5 10*3/uL (ref 0.1–1.0)
Monocytes Relative: 11.1 % (ref 3.0–12.0)
Neutro Abs: 3.1 10*3/uL (ref 1.4–7.7)
Neutrophils Relative %: 69.5 % (ref 43.0–77.0)
Platelets: 172 10*3/uL (ref 150.0–400.0)
RBC: 4.32 Mil/uL (ref 4.22–5.81)
RDW: 13.5 % (ref 11.5–15.5)
WBC: 4.5 10*3/uL (ref 4.0–10.5)

## 2023-03-30 LAB — LIPID PANEL
Cholesterol: 162 mg/dL (ref 0–200)
HDL: 49.5 mg/dL (ref >39.00)
LDL Cholesterol: 88 mg/dL (ref 0–99)
NonHDL: 112.06
Total CHOL/HDL Ratio: 3
Triglycerides: 122 mg/dL (ref 0.0–149.0)
VLDL: 24.4 mg/dL (ref 0.0–40.0)

## 2023-03-30 LAB — PSA: PSA: 1.09 ng/mL (ref 0.10–4.00)

## 2023-03-30 MED ORDER — VALSARTAN 80 MG PO TABS: 80.0000 mg | ORAL_TABLET | Freq: Every day | ORAL | 3 refills | Status: DC

## 2023-03-30 NOTE — Patient Instructions (Addendum)
Let us know if you get your flu shot this fall.  blood pressure looks great at home- we are going to change to valsartan 80 mg instead of lisinopril 10 mg- I want him to let me know if cough doesn't resolve within 6 weeks or sooner if worsens  Please stop by lab before you go If you have mychart- we will send your results within 3 business days of [place] receiving them.  If you do not have mychart- we will call you about results within 5 business days of [place] receiving them.  *please also note that you will see labs on mychart as soon as they post. I will later go in and write notes on them- will say "notes from Dr. Durene Cal"   Recommended follow up: Return in about 6 months (around 09/30/2023) for followup or sooner if needed.Schedule b4 you leave.

## 2023-03-30 NOTE — Progress Notes (Signed)
Phone: [phone]   Subjective:  Patient presents today for their annual physical. Chief complaint-noted.   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 Nicholas Lose   IBS (irritable bowel syndrome)    MRSA (methicillin resistant staph aureus) culture positive    hx of due to ingrown hair    10  yrs ago   PONV (postoperative nausea and vomiting)    Tubular adenoma 01/28/2021   Patient Active Problem List   Diagnosis Date Noted   Essential hypertension 03/30/2023    Priority: Medium    Macular degeneration 03/27/2022    Priority: Medium    Aortic atherosclerosis (HCC) 09/24/2021    Priority: Medium    GAD (generalized anxiety disorder) 09/24/2021    Priority: Medium    Primary insomnia 09/24/2021    Priority: Medium    Small intestinal bacterial overgrowth (SIBO) ? 06/11/2021    Priority: Medium    History of adenomatous polyp of colon 09/24/2021    Priority: Low   Genital herpes 09/24/2021    Priority: Low   IBS (irritable bowel syndrome) 01/16/2021    Priority: Low   Primary osteoarthritis of right knee 02/06/2016    Priority: Low   Solitary pulmonary nodule 02/06/2016    Priority: Low   Chronic fatigue syndrome 11/26/2021   Shingles 11/26/2021   Testicle pain 05/20/2012   Past Surgical History:  Procedure Laterality Date   APPENDECTOMY     COLONOSCOPY  2013   Negative screening exam at [facility]   COLONOSCOPY W/ BIOPSIES  01/28/2021   tubular adenoma   KNEE SURGERY Right    2 surgeries  prior to replacement on meniscus   left shoulder repair Left 06/2018   Dr. Luiz Blare   TOTAL KNEE ARTHROPLASTY Right 02/06/2016   Dr. Luiz Blare. Procedure: TOTAL KNEE ARTHROPLASTY;  Surgeon: Jodi Geralds, MD;  Location: [facility] OR;  Service: Orthopedics;  Laterality: Right;    Family History  Problem Relation Age of Onset   Hypertension Mother    Irritable bowel syndrome Mother    Other Mother        pagets skin   Cancer Father        died within 20 days- unclear original cause- several mets   Heart attack Maternal Grandmother    Heart attack Maternal Grandfather    Heart attack Paternal Grandmother    Heart attack Paternal Grandfather    Colon cancer Neg Hx    Stomach cancer Neg Hx    Esophageal cancer Neg Hx    Pancreatic cancer Neg Hx    Rectal cancer Neg Hx     Medications- reviewed and updated Current Outpatient Medications  Medication Sig Dispense Refill   ascorbic acid (VITAMIN C) 1000 MG tablet Take 2,000 mg by mouth daily.     busPIRone (BUSPAR) 5 MG tablet Take 1 tablet (5 mg total) by mouth 3 (three) times daily. 270 tablet 3   Cholecalciferol (VITAMIN D3) 50 MCG (2000 UT) TABS Take 1 tablet by  mouth daily.     escitalopram (LEXAPRO) 20 MG tablet TAKE ONE TABLET BY MOUTH ONCE DAILY 90 tablet 3   fluticasone (FLONASE) 50 MCG/ACT nasal spray Place 2 sprays into both nostrils daily. 16 g 6   loratadine (CLARITIN) 10 MG tablet Take 1 tablet (10 mg total) by mouth daily. 30 tablet 11   meloxicam (MOBIC) 15 MG tablet Take 15 mg by mouth daily.     Multiple Vitamins-Minerals (ICAPS AREDS 2 PO) Take 2 capsules by mouth daily.     Multiple Vitamins-Minerals (MULTIVITAMIN WITH MINERALS) tablet Take 1 tablet by mouth daily.     omeprazole (PRILOSEC) 20 MG capsule TAKE ONE CAPSULE BY MOUTH ONCE DAILY 90 capsule 3   Probiotic Product (PROBIOTIC DAILY PO) Take 1 tablet by mouth daily.     rosuvastatin (CRESTOR) 5 MG tablet TAKE ONE TABLET BY MOUTH ONCE DAILY 90 tablet 3   Saline (SIMPLY  SALINE) 0.9 % AERS Place 2 each into the nose as directed. Use nightly for sinus hygiene long-term.  Can also be used as many times daily as desired to assist with clearing congested sinuses. 127 mL 11   sildenafil (VIAGRA) 100 MG tablet TAKE ONE TABLET BY MOUTH ONCE DAILY AS NEEDED 148 tablet 0   tobramycin (TOBREX) 0.3 % ophthalmic solution Place 1 drop into the left eye 4 (four) times daily.     valACYclovir (VALTREX) 500 MG tablet TAKE ONE TABLET BY MOUTH ONCE DAILY 90 tablet 3   eszopiclone (LUNESTA) 2 MG TABS tablet TAKE ONE TABLET BY MOUTH EVERYDAY AT BEDTIME AS NEEDED FOR INSOMNIA (Patient not taking: Reported on 03/30/2023) 30 tablet 5   No current facility-administered medications for this visit.    Allergies-reviewed and updated No Known Allergies  Social History   Social History Narrative   Married in 2022. 1 daughter from prior marriage- 35 years old in 2023- no grandkids yet.       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 Oropharynx normal Neck: no thyromegaly CV: RRR no murmurs rubs or gallops Lungs: CTAB no crackles, wheeze, rhonchi Abdomen: soft/nontender/nondistended/normal bowel sounds. No rebound or guarding.  Ext: no edema Skin: warm, dry Neuro: grossly normal, moves all extremities, PERRLA   Assessment and Plan  67 y.o. male presenting for annual physical.  Health Maintenance counseling: 1. Anticipatory guidance: Patient counseled regarding regular dental exams -q4 months, eye exams -yearly and more regularly for macular degeneration shots q8 weeks,  avoiding smoking and second hand  smoke, limiting alcohol to 2 beverages per day - well under that, no illicit drugs .   2. Risk factor reduction:  Advised patient of need for regular exercise and diet rich and fruits and vegetables to reduce risk of heart attack and stroke.  Exercise-gardening and aggressive  yard work, English as a second language teacher, 3 to 5 miles walking per day with work.  Diet/weight management-stable from last year- around 205 at home though- had gotten as low as 193 last year- reasonable goal when things settle with work and home.  Wt Readings from Last  3 Encounters:  03/30/23 210 lb 12.8 oz (95.6 kg)  03/11/23 205 lb (93 kg)  03/11/23 210 lb 3.2 oz (95.3 kg)  3. Immunizations/screenings/ancillary studies-declines flu and COVID shot  Immunization History  Administered Date(s) Administered   Janssen (J&J) SARS-COV-2 Vaccination 10/12/2019   Tdap 03/27/2022   Zoster Recombinant(Shingrix) 10/13/2018, 04/12/2019   4. Prostate cancer screening- prior PSA range with [facility] in the 0.5-1.2 range.  Low risk trend overall-update PSA with labs Lab Results  Component Value Date   PSA 0.85 03/27/2022   5. Colon cancer screening - January 28, 2021 with 7-year repeat planned 6. Skin cancer screening-sees dermatology yearly. advised regular sunscreen use. Denies worrisome, changing, or new skin lesions.  7. Smoking associated screening (lung cancer screening, AAA screen 65-75, UA)-never smoker 8. STD screening -only active with wife  Status of chronic or acute concerns   # Chest pressure-recently seen on 03/11/2023 by Dr. Jon Billings and was referred to the emergency department to rule out acute coronary syndrome.  He was given a dose of aspirin before he left.  In the emergency room-EKG was reassuring other than right bundle branch block replacing prior incomplete bundle branch block.  Patient had cardiac monitoring without any abnormalities.  Patient also had a cough for 1 week as well as some shortness of breath in addition to 3 days  of intermittent left chest pain..  Delta troponins were negative.  Low heart score.  Pulmonary embolism was thought low likelihood.  Aortic dissection thought low likelihood.  No evidence of CHF on x-ray.  Did have some left-sided atelectasis and there was consideration of early pneumonia and he was discharged on amoxicillin.  He was also started on lisinopril 10 mg as blood pressure was elevated in the emergency department  -no lingering chest pain other than occasional dull ache- does see cardiology early October just to be on safe side  #hypertension S: medication: lisinopril 10 mg. Some raspy cough on this- not sure if lingering from late July or if due to lisinopril Home readings #s: 123-135/72-84 BP Readings from Last 3 Encounters:  03/30/23 128/74  03/11/23 (!) 149/102  03/11/23 (!) 160/88  A/P: blood pressure looks great at home- we are going to change to valsartan 80 mg instead of lisinopril 10 mg- I want him to let me know if cough doesn't resolve within 6 weeks or sooner if worsens   # Aortic atherosclerosis-incidental finding on CT abdomen/pelvis 2022 and CT chest 2019 #hyperlipidemia S: Medication:rosuvastatin 5 mg daily  Lab Results  Component Value Date   CHOL 210 (H) 03/27/2022   HDL 46.40 03/27/2022   LDLCALC 139 (H) 03/27/2022   TRIG 125.0 03/27/2022   CHOLHDL 5 03/27/2022   A/P: hopefully improved now on rosuvastatin- update lipid panel today Aortic atherosclerosis (presumed stable)- LDL goal ideally <70 - hopefully at goal- continue current medications    # Anxiety/insomnia S:Medication: Lexapro 20 mg for at least 6 years, buspirone 5 mg twice daily mainly, eszopiclone 2 mg most nights Counseling: Would consider but difficult with time constraints    03/30/2023    8:03 AM 03/27/2022   11:04 AM 11/26/2021    3:50 PM  GAD 7 : Generalized Anxiety Score  Nervous, Anxious, on Edge 0 0 2  Control/stop worrying 0 0 2  Worry too much - different things 1 2 1   Trouble  relaxing 0 1 1  Restless 0 1 0  Easily annoyed or irritable 0 0 2  Afraid - awful might  happen 0 0 0  Total GAD 7 Score 1 4 8   Anxiety Difficulty Not difficult at all Not difficult at all Somewhat difficult  A/P: reasonable control given all his stressors- continue current medications    # GERD-omeprazole 20 mg daily for years-B12 normal last year   #Left hip pain- sees [facility]- some meloxicam- has had shot and that has been helpful recently  #HSV- stays on suppression with valtrex and no issues  #allergies- more in spring- has claritin available- off prior saline and Flonase form Dr. Jon Billings  Recommended follow up: Return in about 6 months (around 09/30/2023) for followup or sooner if needed.Schedule b4 you leave. Future Appointments  Date Time Provider Department Center  05/14/2023  3:40 PM Little Ishikawa, MD [facility] None   Lab/Order associations: fasting   ICD-10-CM   1. Preventative health care  Z00.00     2. Aortic atherosclerosis (HCC)  I70.0     3. GAD (generalized anxiety disorder)  F41.1     4. Screening for prostate cancer  Z12.5     5. Hyperlipidemia, unspecified hyperlipidemia type  E78.5     6. Essential hypertension  I10       No orders of the defined types were placed in this encounter.   Return precautions advised.  Tana Conch, MD

## 2023-05-06 ENCOUNTER — Telehealth: Payer: Self-pay | Admitting: Family Medicine

## 2023-05-06 NOTE — Telephone Encounter (Signed)
Pt is needing to have all of his refills transferred from [facility] (bank[facility]) to [facility].   Prescription Request  05/06/2023  LOV: 03/30/2023  What is the name of the medication or equipment?   valsartan (DIOVAN) 80 MG tablet   valACYclovir (VALTREX) 500 MG tablet   rosuvastatin (CRESTOR) 5 MG tablet   Have you contacted your pharmacy to request a refill? Yes   Which pharmacy would you like this sent to?  [facility] #[id number] - [place], [place] - [address] AT [facility] OF [address] [place] [address] Phone: [phone] Fax: [phone]    Patient notified that their request is being sent to the clinical staff for review and that they should receive a response within 2 business days.   Please advise at Mobile [phone] (mobile)

## 2023-05-07 MED ORDER — VALSARTAN 80 MG PO TABS: 80.0000 mg | ORAL_TABLET | Freq: Every day | ORAL | 3 refills | Status: DC

## 2023-05-07 MED ORDER — VALACYCLOVIR HCL 500 MG PO TABS: 500.0000 mg | ORAL_TABLET | Freq: Every day | ORAL | 3 refills | Status: DC

## 2023-05-07 MED ORDER — ROSUVASTATIN CALCIUM 5 MG PO TABS: 5.0000 mg | ORAL_TABLET | Freq: Every day | ORAL | 3 refills | Status: DC

## 2023-05-07 NOTE — Telephone Encounter (Signed)
Refill sent to requested pharmacy.

## 2023-05-14 ENCOUNTER — Encounter: Payer: Self-pay | Admitting: Cardiology

## 2023-05-14 ENCOUNTER — Ambulatory Visit: Payer: BC Managed Care – PPO | Attending: Cardiology | Admitting: Cardiology

## 2023-05-14 VITALS — BP 130/76 | HR 66 | Ht 72.0 in | Wt 210.0 lb

## 2023-05-14 DIAGNOSIS — R079 Chest pain, unspecified: Secondary | ICD-10-CM

## 2023-05-14 DIAGNOSIS — E785 Hyperlipidemia, unspecified: Secondary | ICD-10-CM

## 2023-05-14 DIAGNOSIS — R0609 Other forms of dyspnea: Secondary | ICD-10-CM

## 2023-05-14 DIAGNOSIS — I10 Essential (primary) hypertension: Secondary | ICD-10-CM | POA: Diagnosis not present

## 2023-05-14 DIAGNOSIS — R9431 Abnormal electrocardiogram [ECG] [EKG]: Secondary | ICD-10-CM

## 2023-05-14 MED ORDER — METOPROLOL TARTRATE 50 MG PO TABS: 50.0000 mg | ORAL_TABLET | Freq: Once | ORAL | 0 refills | Status: DC

## 2023-05-14 NOTE — Patient Instructions (Addendum)
Medication Instructions:    Metoprolol tartrate 50 mg  one time dose see below  *If you need a refill on your cardiac medications before your next appointment, please call your pharmacy*   Lab Work: BMP  If you have labs (blood work) drawn today and your tests are completely normal, you will receive your results only by: MyChart Message (if you have MyChart) OR A paper copy in the mail If you have any lab test that is abnormal or we need to change your treatment, we will call you to review the results.   Testing/Procedures: Will be schedule at [facility] [address] Your physician has requested that you have an echocardiogram. Echocardiography is a painless test that uses sound waves to create images of your heart. It provides your doctor with information about the size and shape of your heart and how well your heart's chambers and valves are working. This procedure takes approximately one hour. There are no restrictions for this procedure. Please do NOT wear cologne, perfume, aftershave, or lotions (deodorant is allowed). Please arrive 15 minutes prior to your appointment time. And   Will be schedule at [facility] in the radiology dept Your physician has requested that you have coronary  CTA. Coronary computed tomography (CT)angiogram  is a special type of CT scan that uses a computer to produce multi-dimensional views of major blood vessels throughout the heart.  CT angiography, a contrast material is injected through an IV to help visualize the blood vessels  a painless test that uses an x-ray machine to take clear, detailed pictures of your heart arteries .  Please follow instruction sheet as given.    Follow-Up: At [facility], you and your health needs are our priority.  As part of our continuing mission to provide you with exceptional heart care, we have created designated Provider Care Teams.  These Care Teams include your primary Cardiologist (physician) and  Advanced Practice Providers (APPs -  Physician Assistants and Nurse Practitioners) who all work together to provide you with the care you need, when you need it.     Your next appointment:   4 month(s)  The format for your next appointment:   In Person  Provider:   Little Ishikawa, MD    Other Instructions     Your cardiac CT will be scheduled at  the below location:   [facility] [address] [place], [place] [address] [phone]   Please arrive at the [facility] and Children's Entrance (Entrance C2) of [facility] 30 minutes prior to test start time. You can use the FREE valet parking offered at entrance C (encouraged to control the heart rate for the test)  Proceed to the [facility] Radiology Department (first floor) to check-in and test prep.  All radiology patients and guests should use entrance C2 at [facility], accessed from [facility], even though the hospital's physical address listed is [address].      Please follow these instructions carefully (unless otherwise directed):  An IV will be required for this test and Nitroglycerin will be given.  Hold all erectile dysfunction medications at least 3 days (72 hrs) prior to test. (Ie viagra, cialis, sildenafil, tadalafil, etc)     Do lab work at least week prior to testing    On the Night Before the Test: Be sure to Drink plenty of water. Do not consume any caffeinated/decaffeinated beverages or chocolate 12 hours prior  to your test. Do not take any antihistamines 12 hours prior to your test.   On the Day of the Test: Drink plenty of water until 1 hour prior to the test. Do not eat any food 1 hour prior to test. You may take your regular medications prior to the test.  Take metoprolol (Lopressor) 50 mg two hours prior to test.       After the Test: Drink plenty of water. After receiving IV contrast, you may experience a mild flushed  feeling. This is normal. On occasion, you may experience a mild rash up to 24 hours after the test. This is not dangerous. If this occurs, you can take Benadryl 25 mg and increase your fluid intake. If you experience trouble breathing, this can be serious. If it is severe call 911 IMMEDIATELY. If it is mild, please call our office.   We will call to schedule your test 2-4 weeks out understanding that some insurance companies will need an authorization prior to the service being performed.   For more information and frequently asked questions, please visit our website : [email or web address]  For non-scheduling related questions, please contact the cardiac imaging nurse navigator should you have any questions/concerns: Cardiac Imaging Nurse Navigators Direct Office Dial: [phone]   For scheduling needs, including cancellations and rescheduling, please call [place], [phone].

## 2023-05-14 NOTE — Progress Notes (Signed)
Cardiology Office Note:    Date:  05/14/2023   ID:  Micheal May, DOB May 08, 1956, MRN [id number]  PCP:  Shelva Majestic, MD  Cardiologist:  Little Ishikawa, MD  Electrophysiologist:  None   Referring MD: Shelva Majestic, MD   Chief Complaint  Patient presents with   Chest Pain    History of Present Illness:    Micheal May is a 67 y.o. male with a hx of GERD, IBS, hypertension, hyperlipidemia who is referred by Dr. Durene Cal for evaluation of chest pain.  He was seen in ED for chest pain 03/11/2023, workup unremarkable.  He reports has been having occasional chest pain.  Describes sharp pain in center of chest.  Was worse the day he went to the ED, states it had been happening over prior few days and occurring every few hours.  Typically would last for about 15 seconds when occurred.  States that he was under a lot of stress.  He typically walks 3 to 6 miles per day at work, does not get chest pain but does report he has shortness of breath with exertion.  Particular with walking up stairs, will have shortness of breath.  He denies any lightheadedness, syncope, lower extremity edema, or palpitations.  No smoking history.  Family history includes father had MI in late 59s and both his grandfathers died of MI at age 20.  Past Medical History:  Diagnosis Date   Anxiety    Arthritis    OA   GERD (gastroesophageal reflux disease)    Grover's disease    has seen Dr. Nicholas Lose   IBS (irritable bowel syndrome)    MRSA (methicillin resistant staph aureus) culture positive    hx of due to ingrown hair    10  yrs ago   PONV (postoperative nausea and vomiting)    Tubular adenoma 01/28/2021    Past Surgical History:  Procedure Laterality Date   APPENDECTOMY     COLONOSCOPY  2013   Negative screening exam at [facility]   COLONOSCOPY W/ BIOPSIES  01/28/2021   tubular adenoma   KNEE SURGERY Right    2 surgeries prior to replacement on meniscus   left shoulder repair Left 06/2018   Dr.  Luiz Blare   TOTAL KNEE ARTHROPLASTY Right 02/06/2016   Dr. Luiz Blare. Procedure: TOTAL KNEE ARTHROPLASTY;  Surgeon: Jodi Geralds, MD;  Location: [facility] OR;  Service: Orthopedics;  Laterality: Right;    Current Medications: Current Meds  Medication Sig   ascorbic acid (VITAMIN C) 1000 MG tablet Take 2,000 mg by mouth daily.   busPIRone (BUSPAR) 5 MG tablet Take 1 tablet (5 mg total) by mouth 3 (three) times daily.   Cholecalciferol (VITAMIN D3) 50 MCG (2000 UT) TABS Take 1 tablet by mouth daily.   escitalopram (LEXAPRO) 20 MG tablet TAKE ONE TABLET BY MOUTH ONCE DAILY   eszopiclone (LUNESTA) 2 MG TABS tablet TAKE ONE TABLET BY MOUTH EVERYDAY AT BEDTIME AS NEEDED FOR INSOMNIA   loratadine (CLARITIN) 10 MG tablet Take 1 tablet (10 mg total) by mouth daily.   meloxicam (MOBIC) 15 MG tablet Take 15 mg by mouth daily.   metoprolol tartrate (LOPRESSOR) 50 MG tablet Take 1 tablet (50 mg total) by mouth once for 1 dose. TAKE TWO HOURS PRIOR TO  SCHEDULE CARDIAC TEST   Multiple Vitamins-Minerals (ICAPS AREDS 2 PO) Take 2 capsules by mouth daily.   Multiple Vitamins-Minerals (MULTIVITAMIN WITH MINERALS) tablet Take 1 tablet by mouth daily.   omeprazole (PRILOSEC) 20  MG capsule TAKE ONE CAPSULE BY MOUTH ONCE DAILY   Probiotic Product (PROBIOTIC DAILY PO) Take 1 tablet by mouth daily.   rosuvastatin (CRESTOR) 5 MG tablet Take 1 tablet (5 mg total) by mouth daily.   sildenafil (VIAGRA) 100 MG tablet TAKE ONE TABLET BY MOUTH ONCE DAILY AS NEEDED   valACYclovir (VALTREX) 500 MG tablet Take 1 tablet (500 mg total) by mouth daily.   valsartan (DIOVAN) 80 MG tablet Take 1 tablet (80 mg total) by mouth daily.     Allergies:   Patient has no known allergies.   Social History   Socioeconomic History   Marital status: Unknown    Spouse name: Not on file   Number of children: 1   Years of education: Not on file   Highest education level: Not on file  Occupational History   Occupation: Art therapist    Comment:  [employer]  Tobacco Use   Smoking status: Never   Smokeless tobacco: Never  Vaping Use   Vaping status: Never Used  Substance and Sexual Activity   Alcohol use: Yes    Alcohol/week: 6.0 - 8.0 standard drinks of alcohol    Types: 6 - 8 Shots of liquor per week    Comment: weekends   Drug use: No   Sexual activity: Yes  Other Topics Concern   Not on file  Social History Narrative   Married in 2022. 1 daughter from prior marriage- 7 years old in 2023- no grandkids yet.       The patient is the Art therapist of [employer]: bowling, yard work, sports- [employer], research (medical), hockey enjoys hurricanes, ra[employer] football, [employer] basketball   Social Determinants of Corporate investment banker Strain: Not on file  Food Insecurity: Not on file  Transportation Needs: Not on file  Physical Activity: Not on file  Stress: Not on file  Social Connections: Not on file     Family History: The patient's family history includes Cancer in his father; Heart attack in his maternal grandfather, maternal grandmother, paternal grandfather, and paternal grandmother; Hypertension in his mother; Irritable bowel syndrome in his mother; Other in his mother. There is no history of Colon cancer, Stomach cancer, Esophageal cancer, Pancreatic cancer, or Rectal cancer.  ROS:   Please see the history of present illness.     All other systems reviewed and are negative.  EKGs/Labs/Other Studies Reviewed:    The following studies were reviewed today:   EKG:   05/14/23: RBBB, NSR  Recent Labs: 03/30/2023: ALT 27; BUN 21; Creatinine, Ser 0.83; Hemoglobin 14.3; Platelets 172.0; Potassium 3.9; Sodium 142  Recent Lipid Panel    Component Value Date/Time   CHOL 162 03/30/2023 0845   TRIG 122.0 03/30/2023 0845   HDL 49.50 03/30/2023 0845   CHOLHDL 3 03/30/2023 0845   VLDL 24.4 03/30/2023 0845   LDLCALC 88 03/30/2023 0845    Physical Exam:    VS:  BP 130/76 (BP Location: Left Arm, Patient  Position: Sitting, Cuff Size: Normal)   Pulse 66   Ht 6' (1.829 m)   Wt 210 lb (95.3 kg)   SpO2 97%   BMI 28.48 kg/m     Wt Readings from Last 3 Encounters:  05/14/23 210 lb (95.3 kg)  03/30/23 210 lb 12.8 oz (95.6 kg)  03/11/23 205 lb (93 kg)     GEN:  Well nourished, well developed in no acute distress HEENT: Normal NECK: No JVD; No carotid  bruits LYMPHATICS: No lymphadenopathy CARDIAC: RRR, no murmurs, rubs, gallops RESPIRATORY:  Clear to auscultation without rales, wheezing or rhonchi  ABDOMEN: Soft, non-tender, non-distended MUSCULOSKELETAL:  No edema; No deformity  SKIN: Warm and dry NEUROLOGIC:  Alert and oriented x 3 PSYCHIATRIC:  Normal affect   ASSESSMENT:    1. Chest pain, unspecified type   2. DOE (dyspnea on exertion)   3. Nonspecific abnormal electrocardiogram (ECG) (EKG)   4. Essential hypertension   5. Hyperlipidemia, unspecified hyperlipidemia type    PLAN:    Chest pain/DOE: Chest pain is atypical in description but also having dyspnea on exertion that could represent anginal equivalent.  Does have multiple CAD risk factors (age, hypertension, hyperlipidemia).  Recommend coronary CTA to evaluate for obstructive CAD.  Will give Lopressor 50 mg prior to study.  Abnormal EKG: Right bundle branch block on EKG.  Check echocardiogram to evaluate for structural heart disease.  Hypertension: On valsartan 80 mg daily.  Appears controlled  Hyperlipidemia: On rosuvastatin 5 mg daily.  LDL 88 on 03/30/23.  Will follow-up results of coronary CTA to guide how aggressive to be in lowering cholesterol  RTC in 4 months   Medication Adjustments/Labs and Tests Ordered: Current medicines are reviewed at length with the patient today.  Concerns regarding medicines are outlined above.  Orders Placed This Encounter  Procedures   CT CORONARY MORPH W/CTA COR W/SCORE W/CA W/CM &/OR WO/CM   Basic metabolic panel   EKG 12-Lead   ECHOCARDIOGRAM COMPLETE   Meds ordered this  encounter  Medications   metoprolol tartrate (LOPRESSOR) 50 MG tablet    Sig: Take 1 tablet (50 mg total) by mouth once for 1 dose. TAKE TWO HOURS PRIOR TO  SCHEDULE CARDIAC TEST    Dispense:  1 tablet    Refill:  0    Patient Instructions  Medication Instructions:    Metoprolol tartrate 50 mg  one time dose see below  *If you need a refill on your cardiac medications before your next appointment, please call your pharmacy*   Lab Work: BMP  If you have labs (blood work) drawn today and your tests are completely normal, you will receive your results only by: MyChart Message (if you have MyChart) OR A paper copy in the mail If you have any lab test that is abnormal or we need to change your treatment, we will call you to review the results.   Testing/Procedures: Will be schedule at [facility] [address] Your physician has requested that you have an echocardiogram. Echocardiography is a painless test that uses sound waves to create images of your heart. It provides your doctor with information about the size and shape of your heart and how well your heart's chambers and valves are working. This procedure takes approximately one hour. There are no restrictions for this procedure. Please do NOT wear cologne, perfume, aftershave, or lotions (deodorant is allowed). Please arrive 15 minutes prior to your appointment time. And   Will be schedule at [facility] in the radiology dept Your physician has requested that you have coronary  CTA. Coronary computed tomography (CT)angiogram  is a special type of CT scan that uses a computer to produce multi-dimensional views of major blood vessels throughout the heart.  CT angiography, a contrast material is injected through an IV to help visualize the blood vessels  a painless test that uses an x-ray machine to take clear, detailed pictures of your heart arteries .  Please follow instruction sheet  as given.    Follow-Up: At [facility], you and your health needs are our priority.  As part of our continuing mission to provide you with exceptional heart care, we have created designated Provider Care Teams.  These Care Teams include your primary Cardiologist (physician) and Advanced Practice Providers (APPs -  Physician Assistants and Nurse Practitioners) who all work together to provide you with the care you need, when you need it.     Your next appointment:   4 month(s)  The format for your next appointment:   In Person  Provider:   Little Ishikawa, MD    Other Instructions     Your cardiac CT will be scheduled at  the below location:   [facility] [address] [place], [place] [address] [phone]   Please arrive at the [facility] and Children's Entrance (Entrance C2) of [facility] 30 minutes prior to test start time. You can use the FREE valet parking offered at entrance C (encouraged to control the heart rate for the test)  Proceed to the [facility] Radiology Department (first floor) to check-in and test prep.  All radiology patients and guests should use entrance C2 at [facility], accessed from [facility], even though the hospital's physical address listed is [address]      Please follow these instructions carefully (unless otherwise directed):  An IV will be required for this test and Nitroglycerin will be given.  Hold all erectile dysfunction medications at least 3 days (72 hrs) prior to test. (Ie viagra, cialis, sildenafil, tadalafil, etc)     Do lab work at least week prior to testing    On the Night Before the Test: Be sure to Drink plenty of water. Do not consume any caffeinated/decaffeinated beverages or chocolate 12 hours prior to your test. Do not take any antihistamines 12 hours prior to your test.   On the Day of the Test: Drink plenty of water until 1 hour prior to the test. Do not eat any food 1 hour  prior to test. You may take your regular medications prior to the test.  Take metoprolol (Lopressor) 50 mg two hours prior to test.       After the Test: Drink plenty of water. After receiving IV contrast, you may experience a mild flushed feeling. This is normal. On occasion, you may experience a mild rash up to 24 hours after the test. This is not dangerous. If this occurs, you can take Benadryl 25 mg and increase your fluid intake. If you experience trouble breathing, this can be serious. If it is severe call 911 IMMEDIATELY. If it is mild, please call our office.   We will call to schedule your test 2-4 weeks out understanding that some insurance companies will need an authorization prior to the service being performed.   For more information and frequently asked questions, please visit our website : [email or web address]  For non-scheduling related questions, please contact the cardiac imaging nurse navigator should you have any questions/concerns: Cardiac Imaging Nurse Navigators Direct Office Dial: [phone]   For scheduling needs, including cancellations and rescheduling, please call [place], [phone].    Signed, Little Ishikawa, MD  05/14/2023 5:37 PM    [facility]

## 2023-05-15 ENCOUNTER — Other Ambulatory Visit: Payer: Self-pay | Admitting: Cardiology

## 2023-05-16 ENCOUNTER — Other Ambulatory Visit: Payer: Self-pay | Admitting: Cardiology

## 2023-05-17 ENCOUNTER — Other Ambulatory Visit: Payer: Self-pay | Admitting: Cardiology

## 2023-05-18 ENCOUNTER — Other Ambulatory Visit: Payer: Self-pay | Admitting: Cardiology

## 2023-05-19 ENCOUNTER — Other Ambulatory Visit: Payer: Self-pay | Admitting: Cardiology

## 2023-05-19 DIAGNOSIS — M25552 Pain in left hip: Secondary | ICD-10-CM | POA: Diagnosis not present

## 2023-05-19 DIAGNOSIS — M1612 Unilateral primary osteoarthritis, left hip: Secondary | ICD-10-CM | POA: Diagnosis not present

## 2023-05-20 ENCOUNTER — Other Ambulatory Visit: Payer: Self-pay | Admitting: Cardiology

## 2023-05-20 ENCOUNTER — Other Ambulatory Visit: Payer: Self-pay | Admitting: Family Medicine

## 2023-05-20 MED ORDER — ESZOPICLONE 2 MG PO TABS: ORAL_TABLET | ORAL | 5 refills | Status: DC

## 2023-05-20 NOTE — Telephone Encounter (Signed)
Prescription Request  05/20/2023  LOV: 03/30/2023  What is the name of the medication or equipment?  eszopiclone (LUNESTA) 2 MG TABS tablet    Have you contacted your pharmacy to request a refill? Yes   Which pharmacy would you like this sent to?  [facility] #[id number] - [place], [place] - [address] AT [facility] OF [address] [place] [address] Phone: [phone] Fax: [phone]    Patient notified that their request is being sent to the clinical staff for review and that they should receive a response within 2 business days.   Please advise at Mobile [phone] (mobile)

## 2023-05-21 ENCOUNTER — Other Ambulatory Visit: Payer: Self-pay | Admitting: Cardiology

## 2023-05-28 DIAGNOSIS — M25552 Pain in left hip: Secondary | ICD-10-CM | POA: Diagnosis not present

## 2023-06-03 ENCOUNTER — Encounter: Payer: Self-pay | Admitting: Nurse Practitioner

## 2023-06-03 ENCOUNTER — Telehealth: Payer: Self-pay | Admitting: Cardiology

## 2023-06-03 ENCOUNTER — Ambulatory Visit: Payer: BC Managed Care – PPO | Admitting: Nurse Practitioner

## 2023-06-03 ENCOUNTER — Ambulatory Visit (INDEPENDENT_AMBULATORY_CARE_PROVIDER_SITE_OTHER)
Admission: RE | Admit: 2023-06-03 | Discharge: 2023-06-03 | Disposition: A | Discharge status: Home / Self Care | Payer: BC Managed Care – PPO | Source: Ambulatory Visit | Attending: Nurse Practitioner | Admitting: Nurse Practitioner

## 2023-06-03 VITALS — BP 138/70 | HR 72 | Temp 98.1°F | Ht 72.0 in | Wt 208.4 lb

## 2023-06-03 DIAGNOSIS — R059 Cough, unspecified: Secondary | ICD-10-CM

## 2023-06-03 MED ORDER — PROMETHAZINE-DM 6.25-15 MG/5ML PO SYRP
5.0000 mL | ORAL_SOLUTION | Freq: Every evening | ORAL | 0 refills | Status: DC | PRN
Start: 2023-06-03 — End: 2024-04-04

## 2023-06-03 MED ORDER — BENZONATATE 100 MG PO CAPS: 100.0000 mg | ORAL_CAPSULE | Freq: Three times a day (TID) | ORAL | 0 refills | Status: DC | PRN

## 2023-06-03 NOTE — Telephone Encounter (Signed)
   Pre-operative Risk Assessment    Patient Name: Micheal May  DOB: 12-20-1955 MRN: [id number]      Request for Surgical Clearance    Procedure:   Left hip replacement  Date of Surgery:  Clearance TBD                                 Surgeon:  Dr. Jodi Geralds Surgeon's Group or Practice Name:  [facility]  Phone number:  [phone]  Fax number:  [phone]   Type of Clearance Requested:   - Medical  - Pharmacy:  Hold        Type of Anesthesia:  Spinal   Additional requests/questions:   Caller Raynelle Fanning) stated patient will need medical and pharmacy clearance  Signed, Annetta Maw   06/03/2023, 4:18 PM

## 2023-06-03 NOTE — Progress Notes (Signed)
Established Patient Office Visit  Subjective   Patient ID: Micheal May, male    DOB: [birth year]  Age: 67 y.o. MRN: [id number]  Chief Complaint  Patient presents with   Cough    Pt c/o cough x2 weeks that is dry and he has been using mucinex.    Patient arrives today for acute visit for the above. Symptom onset 2 weeks ago. His main concern is a dry cough. He has some associated fatigue, but no fever, body aches, chills, wheezing, or shortness of breath. He has been taking mucinex at home.     Review of Systems  Constitutional:  Positive for malaise/fatigue. Negative for chills and fever.  Respiratory:  Positive for cough and shortness of breath. Negative for wheezing.   Musculoskeletal:  Negative for myalgias.  Neurological:  Negative for headaches.      Objective:     BP 138/70   Pulse 72   Temp 98.1 F (36.7 C)   Ht 6' (1.829 m)   Wt 208 lb 6.4 oz (94.5 kg)   SpO2 98%   BMI 28.26 kg/m  BP Readings from Last 3 Encounters:  06/03/23 138/70  05/14/23 130/76  03/30/23 128/74   Wt Readings from Last 3 Encounters:  06/03/23 208 lb 6.4 oz (94.5 kg)  05/14/23 210 lb (95.3 kg)  03/30/23 210 lb 12.8 oz (95.6 kg)      Physical Exam Vitals reviewed.  Constitutional:      Appearance: Normal appearance.  HENT:     Head: Normocephalic and atraumatic.  Cardiovascular:     Rate and Rhythm: Normal rate and regular rhythm.  Pulmonary:     Effort: Pulmonary effort is normal.     Breath sounds: Normal breath sounds.  Musculoskeletal:     Cervical back: Neck supple.  Skin:    General: Skin is warm and dry.  Neurological:     Mental Status: He is alert and oriented to person, place, and time.  Psychiatric:        Mood and Affect: Mood normal.        Behavior: Behavior normal.        Thought Content: Thought content normal.        Judgment: Judgment normal.      No results found for any visits on 06/03/23.    The 10-year ASCVD risk score (Arnett DK, et al.,  2019) is: 13.4%    Assessment & Plan:   Problem List Items Addressed This Visit       Other   Cough - Primary    Acute Likely post-viral cough. Treat symptoms with as needed mucinex, benzonatate during the day as needed, and promethazine-dextromethorphan cough syrup at night as needed for cough suppression. Educated patient to avoid driving or operating heavy machinery when using the cough syrup. He reports his understanding. Will obtain chest xray today as well to rule out pneumonia. Plan may change based on those results.   Discussed treating with prednisone, but patient reported he had been taking prednisone as prescribed by his orthopedist for hip pain over the last week. Thus, will hold off on additional prednisone at this time unless patient's symptoms worsen at which point patient was encouraged to call the office.       Relevant Medications   benzonatate (TESSALON) 100 MG capsule   promethazine-dextromethorphan (PROMETHAZINE-DM) 6.25-15 MG/5ML syrup   Other Relevant Orders   DG Chest 2 View    Return if symptoms worsen or fail to improve.  Elenore Paddy, NP

## 2023-06-03 NOTE — Assessment & Plan Note (Signed)
Acute Likely post-viral cough. Treat symptoms with as needed mucinex, benzonatate during the day as needed, and promethazine-dextromethorphan cough syrup at night as needed for cough suppression. Educated patient to avoid driving or operating heavy machinery when using the cough syrup. He reports his understanding. Will obtain chest xray today as well to rule out pneumonia. Plan may change based on those results.   Discussed treating with prednisone, but patient reported he had been taking prednisone as prescribed by his orthopedist for hip pain over the last week. Thus, will hold off on additional prednisone at this time unless patient's symptoms worsen at which point patient was encouraged to call the office.

## 2023-06-04 ENCOUNTER — Encounter (HOSPITAL_COMMUNITY): Payer: Self-pay

## 2023-06-05 ENCOUNTER — Telehealth: Payer: Self-pay | Admitting: Family Medicine

## 2023-06-05 DIAGNOSIS — R079 Chest pain, unspecified: Secondary | ICD-10-CM | POA: Diagnosis not present

## 2023-06-05 LAB — BASIC METABOLIC PANEL
BUN/Creatinine Ratio: 17 (ref 10–24)
BUN: 17 mg/dL (ref 8–27)
CO2: 29 mmol/L (ref 20–29)
Calcium: 9.4 mg/dL (ref 8.6–10.2)
Chloride: 101 mmol/L (ref 96–106)
Creatinine, Ser: 0.98 mg/dL (ref 0.76–1.27)
Glucose: 79 mg/dL (ref 70–99)
Potassium: 4.3 mmol/L (ref 3.5–5.2)
Sodium: 143 mmol/L (ref 134–144)
eGFR: 86 mL/min/{1.73_m2} (ref >59)

## 2023-06-05 NOTE — Telephone Encounter (Signed)
Prescription Request  06/05/2023  LOV: 03/30/2023  What is the name of the medication or equipment? busPIRone (BUSPAR) 5 MG tablet   Have you contacted your pharmacy to request a refill? Yes   Which pharmacy would you like this sent to?  [id number] - [place], [place] - [address] AT [facility] OF [address] [place] [address] Phone: [phone] Fax: [phone]    Patient notified that their request is being sent to the clinical staff for review and that they should receive a response within 2 business days.   Please advise at Mobile [phone] (mobile)

## 2023-06-08 ENCOUNTER — Telehealth (HOSPITAL_COMMUNITY): Payer: Self-pay | Admitting: *Deleted

## 2023-06-08 ENCOUNTER — Ambulatory Visit (HOSPITAL_COMMUNITY): Payer: BC Managed Care – PPO | Attending: Cardiology

## 2023-06-08 DIAGNOSIS — R079 Chest pain, unspecified: Secondary | ICD-10-CM | POA: Insufficient documentation

## 2023-06-08 LAB — ECHOCARDIOGRAM COMPLETE
Area-P 1/2: 2.93 cm2
Est EF: 55
S' Lateral: 3.4 cm

## 2023-06-08 MED ORDER — BUSPIRONE HCL 5 MG PO TABS: 5.0000 mg | ORAL_TABLET | Freq: Three times a day (TID) | ORAL | 3 refills | Status: DC

## 2023-06-08 NOTE — Telephone Encounter (Signed)
Refill sent to requested pharmacy.

## 2023-06-08 NOTE — Telephone Encounter (Signed)
Attempted to call patient regarding upcoming cardiac CT appointment. °Left message on voicemail with name and callback number ° °Edie Vallandingham RN Navigator Cardiac Imaging °[facility] °[phone] Office °[phone] Cell ° °

## 2023-06-09 ENCOUNTER — Ambulatory Visit (HOSPITAL_COMMUNITY)
Admission: RE | Admit: 2023-06-09 | Discharge: 2023-06-09 | Disposition: A | Discharge status: Home / Self Care | Payer: BC Managed Care – PPO | Source: Ambulatory Visit | Attending: Cardiology | Admitting: Cardiology

## 2023-06-09 DIAGNOSIS — R072 Precordial pain: Secondary | ICD-10-CM

## 2023-06-09 DIAGNOSIS — R079 Chest pain, unspecified: Secondary | ICD-10-CM | POA: Insufficient documentation

## 2023-06-09 MED ORDER — NITROGLYCERIN 0.4 MG SL SUBL
SUBLINGUAL_TABLET | SUBLINGUAL | Status: AC
  Filled 2023-06-09: qty 2

## 2023-06-09 MED ORDER — NITROGLYCERIN 0.4 MG SL SUBL
0.8000 mg | SUBLINGUAL_TABLET | Freq: Once | SUBLINGUAL | Status: AC
  Administered 2023-06-09: 0.8 mg via SUBLINGUAL

## 2023-06-09 MED ORDER — IOHEXOL 350 MG/ML SOLN
95.0000 mL | Freq: Once | INTRAVENOUS | Status: AC | PRN
  Administered 2023-06-09: 95 mL via INTRAVENOUS

## 2023-06-09 NOTE — Telephone Encounter (Signed)
   Patient Name: Micheal May  DOB: 10-03-1955 MRN: [id number]  Primary Cardiologist: Little Ishikawa, MD  Chart reviewed as part of pre-operative protocol coverage. Given past medical history and time since last visit, based on ACC/AHA guidelines, Micheal May is at acceptable risk for the planned procedure without further cardiovascular testing.   -Patient completed coronary CTA that showed nonobstructive disease and 2D echo revealed no significant abnormalities.  The patient was advised that if he develops new symptoms prior to surgery to contact our office to arrange for a follow-up visit, and he verbalized understanding.  I will route this recommendation to the requesting party via Epic fax function and remove from pre-op pool.  Please call with questions.  Napoleon Form, Leodis Rains, NP 06/09/2023, 12:53 PM

## 2023-06-10 DIAGNOSIS — H353112 Nonexudative age-related macular degeneration, right eye, intermediate dry stage: Secondary | ICD-10-CM | POA: Diagnosis not present

## 2023-06-10 DIAGNOSIS — H353221 Exudative age-related macular degeneration, left eye, with active choroidal neovascularization: Secondary | ICD-10-CM | POA: Diagnosis not present

## 2023-06-10 DIAGNOSIS — H43813 Vitreous degeneration, bilateral: Secondary | ICD-10-CM | POA: Diagnosis not present

## 2023-06-10 DIAGNOSIS — H2513 Age-related nuclear cataract, bilateral: Secondary | ICD-10-CM | POA: Diagnosis not present

## 2023-06-11 ENCOUNTER — Encounter: Payer: Self-pay | Admitting: Family Medicine

## 2023-06-12 ENCOUNTER — Other Ambulatory Visit: Payer: Self-pay

## 2023-06-12 ENCOUNTER — Ambulatory Visit: Payer: BC Managed Care – PPO | Admitting: Family Medicine

## 2023-06-12 ENCOUNTER — Encounter: Payer: Self-pay | Admitting: Family Medicine

## 2023-06-12 VITALS — BP 136/80 | HR 81 | Temp 97.3°F | Ht 72.0 in | Wt 208.2 lb

## 2023-06-12 DIAGNOSIS — E785 Hyperlipidemia, unspecified: Secondary | ICD-10-CM

## 2023-06-12 DIAGNOSIS — R059 Cough, unspecified: Secondary | ICD-10-CM

## 2023-06-12 MED ORDER — AZITHROMYCIN 250 MG PO TABS: ORAL_TABLET | ORAL | 0 refills | Status: DC

## 2023-06-12 MED ORDER — ROSUVASTATIN CALCIUM 10 MG PO TABS: 10.0000 mg | ORAL_TABLET | Freq: Every day | ORAL | 3 refills | Status: DC

## 2023-06-12 NOTE — Progress Notes (Signed)
   Micheal May is a 67 y.o. male who presents today for an office visit.  Assessment/Plan:  Cough Concern for bacterial infection given length of symptoms over the last 3 weeks as well as worsening symptoms over the last few days.  He does have a few rhonchi and wheezes on exam today which is a change from his exam from a week ago.  He has no signs of respiratory distress or increased work of breathing.  No signs of systemic illness and vital signs are normal.  We will start azithromycin for presumed bacterial infection.  He can use over-the-counter meds as well.  Encouraged hydration.  He will let us know if not improving.  We discussed reasons to return to care.     Subjective:  HPI:  Patient here with cough. This started 3 weeks ago. Symptoms are worsening the last few days. Symptoms are worse in the evening. Tried taking mucinex and cough syurp without much improvement. No fevers or chills. No body aches. He is having some chest pain.  No post nasal drip.        Objective:  Physical Exam: BP 136/80 (BP Location: Left Arm, Patient Position: Sitting, Cuff Size: Normal)   Pulse 81   Temp (!) 97.3 F (36.3 C) (Temporal)   Ht 6' (1.829 m)   Wt 208 lb 4 oz (94.5 kg)   SpO2 96%   BMI 28.24 kg/m   Gen: No acute distress, resting comfortably CV: Regular rate and rhythm with no murmurs appreciated Pulm: Normal work of breathing, coarse rhonchi noted in left lung base with occasional rale and wheeze.  Remainder of lung fields are clear. Neuro: Grossly normal, moves all extremities Psych: Normal affect and thought content      Micheal Gleed M. Jimmey Ralph, MD 06/12/2023 1:15 PM

## 2023-06-12 NOTE — Patient Instructions (Signed)
It was very nice to see you today!  You may be developing bronchitis or pneumonia.  Please start azithromycin.  Let us know if not improving.  Return if symptoms worsen or fail to improve.   Take care, Dr Jimmey Ralph  PLEASE NOTE:  If you had any lab tests, please let us know if you have not heard back within a few days. You may see your results on mychart before we have a chance to review them but we will give you a call once they are reviewed by [place].   If we ordered any referrals today, please let us know if you have not heard from their office within the next week.   If you had any urgent prescriptions sent in today, please check with the pharmacy within an hour of our visit to make sure the prescription was transmitted appropriately.   Please try these tips to maintain a healthy lifestyle:  Eat at least 3 REAL meals and 1-2 snacks per day.  Aim for no more than 5 hours between eating.  If you eat breakfast, please do so within one hour of getting up.   Each meal should contain half fruits/vegetables, one quarter protein, and one quarter carbs (no bigger than a computer mouse)  Cut down on sweet beverages. This includes juice, soda, and sweet tea.   Drink at least 1 glass of water with each meal and aim for at least 8 glasses per day  Exercise at least 150 minutes every week.

## 2023-06-17 ENCOUNTER — Encounter: Payer: Self-pay | Admitting: Family

## 2023-06-17 ENCOUNTER — Ambulatory Visit: Payer: BC Managed Care – PPO | Admitting: Family

## 2023-06-17 VITALS — BP 142/90 | HR 78 | Temp 98.0°F | Ht 72.0 in | Wt 208.0 lb

## 2023-06-17 DIAGNOSIS — J208 Acute bronchitis due to other specified organisms: Secondary | ICD-10-CM

## 2023-06-17 MED ORDER — ALBUTEROL SULFATE HFA 108 (90 BASE) MCG/ACT IN AERS: 2.0000 | INHALATION_SPRAY | Freq: Four times a day (QID) | RESPIRATORY_TRACT | 0 refills | Status: DC | PRN

## 2023-06-17 MED ORDER — PREDNISONE 10 MG PO TABS: ORAL_TABLET | ORAL | 0 refills | Status: DC

## 2023-06-17 MED ORDER — ALBUTEROL SULFATE HFA 108 (90 BASE) MCG/ACT IN AERS: 1.0000 | INHALATION_SPRAY | Freq: Four times a day (QID) | RESPIRATORY_TRACT | 0 refills | Status: DC | PRN

## 2023-06-17 NOTE — Progress Notes (Signed)
Patient ID: Micheal May, male    DOB: 07/24/1956, 67 y.o.   MRN: [id number]  Chief Complaint  Patient presents with   Cough    Pt c/o cough with slight green mucus. Pt was seen in office with Dr. Jimmey Ralph on 06/12/2023.    Discussed the use of AI scribe software for clinical note transcription with the patient, who gave verbal consent to proceed.  History of Present Illness   The patient, with a history of recent sinusitis, presents with a persistent cough of four weeks duration. The cough is described as 'violent' and 'dry,' with occasional morning production of green sputum. He reports no wheezing, chest tightness, or pain associated with the cough. He denies any postnasal drip or throat clearing and no recent fever. He has no history of asthma.  Three weeks prior, he was evaluated and treated with Tessalon Perles and cough medicine for a presumed viral infection. More recently, he completed a five-day course of azithromycin (Z-Pak) starting on Monday of the current week. The cough has persisted despite these treatments.  The patient also reports a history of hip pain, for which he received a cortisone injection and a seven-day course of prednisone. The prednisone treatment ended on the day of his virtual medical visit, and it did not alleviate the hip pain. He is currently awaiting hip replacement surgery in January, but hoping he can get done sooner.       Assessment & Plan:     Bronchitis -  Persistent cough for 4 weeks, initially productive with green sputum, now mostly dry, occ. mucus. No history of asthma. Recent treatment with Z-Pak without significant improvement. Lung sounds suggest some wheezing. -Start a course of prednisone at a lower dose than previously for 5 days to reduce inflammation. -Prescribing albuterol inhaler, instructed to use 1-2 puffs as needed, especially in the morning and before bedtime. -If no improvement by Monday, consider another round of antibiotics.       Subjective:    Outpatient Medications Prior to Visit  Medication Sig Dispense Refill   ascorbic acid (VITAMIN C) 1000 MG tablet Take 2,000 mg by mouth daily.     busPIRone (BUSPAR) 5 MG tablet Take 1 tablet (5 mg total) by mouth 3 (three) times daily. 270 tablet 3   Cholecalciferol (VITAMIN D3) 50 MCG (2000 UT) TABS Take 1 tablet by mouth daily.     cyclobenzaprine (FLEXERIL) 10 MG tablet Take 10 mg by mouth every 8 (eight) hours as needed.     escitalopram (LEXAPRO) 20 MG tablet TAKE ONE TABLET BY MOUTH ONCE DAILY 90 tablet 3   eszopiclone (LUNESTA) 2 MG TABS tablet TAKE ONE TABLET BY MOUTH EVERYDAY AT BEDTIME AS NEEDED FOR INSOMNIA 30 tablet 5   loratadine (CLARITIN) 10 MG tablet Take 1 tablet (10 mg total) by mouth daily. 30 tablet 11   meloxicam (MOBIC) 15 MG tablet Take 15 mg by mouth daily.     metoprolol tartrate (LOPRESSOR) 50 MG tablet TAKE 1 TABLET BY MOUTH ONCE FOR 1 DOSE, TAKE 2 HOURS PRIOR TO SCHEDULE CARDIAC TEST 1 tablet 0   Multiple Vitamins-Minerals (ICAPS AREDS 2 PO) Take 2 capsules by mouth daily.     Multiple Vitamins-Minerals (MULTIVITAMIN WITH MINERALS) tablet Take 1 tablet by mouth daily.     omeprazole (PRILOSEC) 20 MG capsule TAKE ONE CAPSULE BY MOUTH ONCE DAILY 90 capsule 3   Probiotic Product (PROBIOTIC DAILY PO) Take 1 tablet by mouth daily.     rosuvastatin (CRESTOR)  10 MG tablet Take 1 tablet (10 mg total) by mouth daily. 90 tablet 3   sildenafil (VIAGRA) 100 MG tablet TAKE ONE TABLET BY MOUTH ONCE DAILY AS NEEDED 148 tablet 0   tobramycin (TOBREX) 0.3 % ophthalmic solution Place 1 drop into the left eye 4 (four) times daily.     valACYclovir (VALTREX) 500 MG tablet Take 1 tablet (500 mg total) by mouth daily. 90 tablet 3   valsartan (DIOVAN) 80 MG tablet Take 1 tablet (80 mg total) by mouth daily. 90 tablet 3   azithromycin (ZITHROMAX) 250 MG tablet Take 2 tabs day 1, then 1 tab daily (Patient not taking: Reported on 06/17/2023) 6 each 0   benzonatate  (TESSALON) 100 MG capsule Take 1 capsule (100 mg total) by mouth 3 (three) times daily as needed for cough. (Patient not taking: Reported on 06/17/2023) 20 capsule 0   promethazine-dextromethorphan (PROMETHAZINE-DM) 6.25-15 MG/5ML syrup Take 5 mLs by mouth at bedtime as needed for cough. (Patient not taking: Reported on 06/17/2023) 118 mL 0   No facility-administered medications prior to visit.   Past Medical History:  Diagnosis Date   Anxiety    Arthritis    OA   GERD (gastroesophageal reflux disease)    Grover's disease    has seen Dr. Nicholas Lose   IBS (irritable bowel syndrome)    MRSA (methicillin resistant staph aureus) culture positive    hx of due to ingrown hair    10  yrs ago   PONV (postoperative nausea and vomiting)    Tubular adenoma 01/28/2021   Past Surgical History:  Procedure Laterality Date   APPENDECTOMY     COLONOSCOPY  2013   Negative screening exam at [facility]   COLONOSCOPY W/ BIOPSIES  01/28/2021   tubular adenoma   KNEE SURGERY Right    2 surgeries prior to replacement on meniscus   left shoulder repair Left 06/2018   Dr. Luiz Blare   TOTAL KNEE ARTHROPLASTY Right 02/06/2016   Dr. Luiz Blare. Procedure: TOTAL KNEE ARTHROPLASTY;  Surgeon: Jodi Geralds, MD;  Location: [facility] OR;  Service: Orthopedics;  Laterality: Right;   No Known Allergies    Objective:    Physical Exam Vitals and nursing note reviewed.  Constitutional:      General: He is not in acute distress.    Appearance: Normal appearance.  HENT:     Head: Normocephalic.  Cardiovascular:     Rate and Rhythm: Normal rate and regular rhythm.  Pulmonary:     Effort: Pulmonary effort is normal.     Breath sounds: Examination of the right-upper field reveals wheezing. Examination of the right-middle field reveals wheezing. Wheezing present.  Musculoskeletal:        General: Normal range of motion.     Cervical back: Normal range of motion.  Skin:    General: Skin is warm and dry.  Neurological:     Mental  Status: He is alert and oriented to person, place, and time.  Psychiatric:        Mood and Affect: Mood normal.    BP (!) 142/90 (BP Location: Left Arm, Patient Position: Sitting, Cuff Size: Large)   Pulse 78   Temp 98 F (36.7 C) (Temporal)   Ht 6' (1.829 m)   Wt 208 lb (94.3 kg)   SpO2 97%   BMI 28.21 kg/m  Wt Readings from Last 3 Encounters:  06/17/23 208 lb (94.3 kg)  06/12/23 208 lb 4 oz (94.5 kg)  06/03/23 208 lb 6.4 oz (  94.5 kg)       Dulce Sellar, NP

## 2023-06-18 DIAGNOSIS — M25562 Pain in left knee: Secondary | ICD-10-CM | POA: Diagnosis not present

## 2023-06-18 DIAGNOSIS — M1612 Unilateral primary osteoarthritis, left hip: Secondary | ICD-10-CM | POA: Diagnosis not present

## 2023-06-19 ENCOUNTER — Encounter: Payer: Self-pay | Admitting: Family Medicine

## 2023-07-06 DIAGNOSIS — M1612 Unilateral primary osteoarthritis, left hip: Secondary | ICD-10-CM | POA: Diagnosis not present

## 2023-07-06 DIAGNOSIS — R262 Difficulty in walking, not elsewhere classified: Secondary | ICD-10-CM | POA: Diagnosis not present

## 2023-07-07 DIAGNOSIS — M1612 Unilateral primary osteoarthritis, left hip: Secondary | ICD-10-CM | POA: Diagnosis not present

## 2023-07-07 DIAGNOSIS — M25552 Pain in left hip: Secondary | ICD-10-CM | POA: Diagnosis not present

## 2023-07-08 ENCOUNTER — Telehealth: Payer: Self-pay | Admitting: Family Medicine

## 2023-07-08 MED ORDER — ESCITALOPRAM OXALATE 20 MG PO TABS: 20.0000 mg | ORAL_TABLET | Freq: Every day | ORAL | 3 refills | Status: DC

## 2023-07-08 MED ORDER — OMEPRAZOLE 20 MG PO CPDR: 20.0000 mg | DELAYED_RELEASE_CAPSULE | Freq: Every day | ORAL | 3 refills | Status: DC

## 2023-07-08 NOTE — Telephone Encounter (Signed)
Refills sent to pharmacy. 

## 2023-07-08 NOTE — Telephone Encounter (Signed)
Prescription Request  07/08/2023  LOV: 03/30/2023  What is the name of the medication or equipment?  escitalopram (LEXAPRO) 20 MG tablet   omeprazole (PRILOSEC) 20 MG capsule    Have you contacted your pharmacy to request a refill? Yes   Which pharmacy would you like this sent to?  [facility] #[id number] - [place], [place] - [address] AT [facility] OF [address] [place] [address] Phone: [phone] Fax: [phone]    Patient notified that their request is being sent to the clinical staff for review and that they should receive a response within 2 business days.   Please advise at Mobile [phone] (mobile)

## 2023-07-22 DIAGNOSIS — Z96642 Presence of left artificial hip joint: Secondary | ICD-10-CM | POA: Diagnosis not present

## 2023-07-22 DIAGNOSIS — M1612 Unilateral primary osteoarthritis, left hip: Secondary | ICD-10-CM | POA: Diagnosis not present

## 2023-07-24 DIAGNOSIS — Z96642 Presence of left artificial hip joint: Secondary | ICD-10-CM | POA: Diagnosis not present

## 2023-07-24 DIAGNOSIS — M6281 Muscle weakness (generalized): Secondary | ICD-10-CM | POA: Diagnosis not present

## 2023-07-24 DIAGNOSIS — M25652 Stiffness of left hip, not elsewhere classified: Secondary | ICD-10-CM | POA: Diagnosis not present

## 2023-07-29 DIAGNOSIS — M6281 Muscle weakness (generalized): Secondary | ICD-10-CM | POA: Diagnosis not present

## 2023-07-29 DIAGNOSIS — M25652 Stiffness of left hip, not elsewhere classified: Secondary | ICD-10-CM | POA: Diagnosis not present

## 2023-07-29 DIAGNOSIS — Z96642 Presence of left artificial hip joint: Secondary | ICD-10-CM | POA: Diagnosis not present

## 2023-07-31 DIAGNOSIS — M6281 Muscle weakness (generalized): Secondary | ICD-10-CM | POA: Diagnosis not present

## 2023-07-31 DIAGNOSIS — Z96642 Presence of left artificial hip joint: Secondary | ICD-10-CM | POA: Diagnosis not present

## 2023-07-31 DIAGNOSIS — M25652 Stiffness of left hip, not elsewhere classified: Secondary | ICD-10-CM | POA: Diagnosis not present

## 2023-08-04 DIAGNOSIS — M25652 Stiffness of left hip, not elsewhere classified: Secondary | ICD-10-CM | POA: Diagnosis not present

## 2023-08-06 DIAGNOSIS — M25652 Stiffness of left hip, not elsewhere classified: Secondary | ICD-10-CM | POA: Diagnosis not present

## 2023-08-06 DIAGNOSIS — Z96642 Presence of left artificial hip joint: Secondary | ICD-10-CM | POA: Diagnosis not present

## 2023-08-06 DIAGNOSIS — M6281 Muscle weakness (generalized): Secondary | ICD-10-CM | POA: Diagnosis not present

## 2023-08-07 DIAGNOSIS — M6281 Muscle weakness (generalized): Secondary | ICD-10-CM | POA: Diagnosis not present

## 2023-08-07 DIAGNOSIS — Z96642 Presence of left artificial hip joint: Secondary | ICD-10-CM | POA: Diagnosis not present

## 2023-08-07 DIAGNOSIS — M25652 Stiffness of left hip, not elsewhere classified: Secondary | ICD-10-CM | POA: Diagnosis not present

## 2023-08-10 DIAGNOSIS — Z96642 Presence of left artificial hip joint: Secondary | ICD-10-CM | POA: Diagnosis not present

## 2023-08-10 DIAGNOSIS — M6281 Muscle weakness (generalized): Secondary | ICD-10-CM | POA: Diagnosis not present

## 2023-08-10 DIAGNOSIS — M25652 Stiffness of left hip, not elsewhere classified: Secondary | ICD-10-CM | POA: Diagnosis not present

## 2023-08-13 DIAGNOSIS — M25652 Stiffness of left hip, not elsewhere classified: Secondary | ICD-10-CM | POA: Diagnosis not present

## 2023-08-13 DIAGNOSIS — Z96642 Presence of left artificial hip joint: Secondary | ICD-10-CM | POA: Diagnosis not present

## 2023-08-13 DIAGNOSIS — M6281 Muscle weakness (generalized): Secondary | ICD-10-CM | POA: Diagnosis not present

## 2023-08-17 DIAGNOSIS — Z96642 Presence of left artificial hip joint: Secondary | ICD-10-CM | POA: Diagnosis not present

## 2023-08-17 DIAGNOSIS — M6281 Muscle weakness (generalized): Secondary | ICD-10-CM | POA: Diagnosis not present

## 2023-08-17 DIAGNOSIS — M25652 Stiffness of left hip, not elsewhere classified: Secondary | ICD-10-CM | POA: Diagnosis not present

## 2023-08-19 DIAGNOSIS — H43813 Vitreous degeneration, bilateral: Secondary | ICD-10-CM | POA: Diagnosis not present

## 2023-08-19 DIAGNOSIS — M25652 Stiffness of left hip, not elsewhere classified: Secondary | ICD-10-CM | POA: Diagnosis not present

## 2023-08-19 DIAGNOSIS — Z96642 Presence of left artificial hip joint: Secondary | ICD-10-CM | POA: Diagnosis not present

## 2023-08-19 DIAGNOSIS — H2513 Age-related nuclear cataract, bilateral: Secondary | ICD-10-CM | POA: Diagnosis not present

## 2023-08-19 DIAGNOSIS — M6281 Muscle weakness (generalized): Secondary | ICD-10-CM | POA: Diagnosis not present

## 2023-08-19 DIAGNOSIS — H353112 Nonexudative age-related macular degeneration, right eye, intermediate dry stage: Secondary | ICD-10-CM | POA: Diagnosis not present

## 2023-08-19 DIAGNOSIS — H353221 Exudative age-related macular degeneration, left eye, with active choroidal neovascularization: Secondary | ICD-10-CM | POA: Diagnosis not present

## 2023-08-24 DIAGNOSIS — M25652 Stiffness of left hip, not elsewhere classified: Secondary | ICD-10-CM | POA: Diagnosis not present

## 2023-08-24 DIAGNOSIS — Z96642 Presence of left artificial hip joint: Secondary | ICD-10-CM | POA: Diagnosis not present

## 2023-08-24 DIAGNOSIS — M6281 Muscle weakness (generalized): Secondary | ICD-10-CM | POA: Diagnosis not present

## 2023-09-01 DIAGNOSIS — M6281 Muscle weakness (generalized): Secondary | ICD-10-CM | POA: Diagnosis not present

## 2023-09-01 DIAGNOSIS — M25652 Stiffness of left hip, not elsewhere classified: Secondary | ICD-10-CM | POA: Diagnosis not present

## 2023-09-01 DIAGNOSIS — Z96642 Presence of left artificial hip joint: Secondary | ICD-10-CM | POA: Diagnosis not present

## 2023-09-04 DIAGNOSIS — Z96642 Presence of left artificial hip joint: Secondary | ICD-10-CM | POA: Diagnosis not present

## 2023-09-04 DIAGNOSIS — M25652 Stiffness of left hip, not elsewhere classified: Secondary | ICD-10-CM | POA: Diagnosis not present

## 2023-09-04 DIAGNOSIS — M6281 Muscle weakness (generalized): Secondary | ICD-10-CM | POA: Diagnosis not present

## 2023-09-08 DIAGNOSIS — M7062 Trochanteric bursitis, left hip: Secondary | ICD-10-CM | POA: Diagnosis not present

## 2023-09-16 ENCOUNTER — Encounter: Payer: Self-pay | Admitting: Cardiology

## 2023-09-16 ENCOUNTER — Ambulatory Visit: Payer: BC Managed Care – PPO | Attending: Cardiology | Admitting: Cardiology

## 2023-09-16 VITALS — BP 115/75 | HR 69 | Ht 72.0 in | Wt 209.6 lb

## 2023-09-16 DIAGNOSIS — E785 Hyperlipidemia, unspecified: Secondary | ICD-10-CM

## 2023-09-16 DIAGNOSIS — R9431 Abnormal electrocardiogram [ECG] [EKG]: Secondary | ICD-10-CM | POA: Diagnosis not present

## 2023-09-16 DIAGNOSIS — I10 Essential (primary) hypertension: Secondary | ICD-10-CM | POA: Diagnosis not present

## 2023-09-16 DIAGNOSIS — I2510 Atherosclerotic heart disease of native coronary artery without angina pectoris: Secondary | ICD-10-CM | POA: Diagnosis not present

## 2023-09-16 LAB — LIPID PANEL
Chol/HDL Ratio: 2.6 {ratio} (ref 0.0–5.0)
Cholesterol, Total: 143 mg/dL (ref 100–199)
HDL: 54 mg/dL (ref >39)
LDL Chol Calc (NIH): 72 mg/dL (ref 0–99)
Triglycerides: 90 mg/dL (ref 0–149)
VLDL Cholesterol Cal: 17 mg/dL (ref 5–40)

## 2023-09-16 NOTE — Progress Notes (Signed)
 Cardiology Office Note:    Date:  09/16/2023   ID:  Micheal May, DOB [birth year], MRN [id number]  PCP:  Katrinka Garnette KIDD, MD  Cardiologist:  Lonni LITTIE Nanas, MD  Electrophysiologist:  None   Referring MD: Katrinka Garnette KIDD, MD   Chief Complaint  Patient presents with   Coronary Artery Disease    History of Present Illness:    Micheal May is a 68 y.o. male with a hx of GERD, IBS, hypertension, hyperlipidemia who presents for follow-up.  He was referred by Dr. Katrinka for evaluation of chest pain, initially seen 05/14/2023.  He was seen in ED for chest pain 03/11/2023, workup unremarkable.    Echocardiogram 06/08/2023 showed EF 55%, normal RV function, no significant valvular disease, mild dilatation of ascending aorta measuring 40 mm.  Coronary CTA 06/09/2023 showed minimal nonobstructive CAD (noncalcified plaque causing less than 25% stenosis in LAD/RCA), calcium  score 0.  Since last clinic visit, he reports he is doing well.  He had hip replacement in December, he is currently doing physical therapy.  Denies any chest pain, dyspnea, lightheadedness, syncope, lower extremity edema, or palpitations.   Past Medical History:  Diagnosis Date   Anxiety    Arthritis    OA   GERD (gastroesophageal reflux disease)    Grover's disease    has seen Dr. Cary   IBS (irritable bowel syndrome)    MRSA (methicillin resistant staph aureus) culture positive    hx of due to ingrown hair    10  yrs ago   PONV (postoperative nausea and vomiting)    Tubular adenoma 01/28/2021    Past Surgical History:  Procedure Laterality Date   APPENDECTOMY     COLONOSCOPY  2013   Negative screening exam at [facility]   COLONOSCOPY W/ BIOPSIES  01/28/2021   tubular adenoma   KNEE SURGERY Right    2 surgeries prior to replacement on meniscus   left shoulder repair Left 06/2018   Dr. Yvone   TOTAL KNEE ARTHROPLASTY Right 02/06/2016   Dr. Yvone. Procedure: TOTAL KNEE ARTHROPLASTY;  Surgeon: Norleen Yvone, MD;  Location: [facility] OR;  Service: Orthopedics;  Laterality: Right;    Current Medications: Current Meds  Medication Sig   ascorbic acid (VITAMIN C) 1000 MG tablet Take 2,000 mg by mouth daily.   busPIRone  (BUSPAR ) 5 MG tablet Take 1 tablet (5 mg total) by mouth 3 (three) times daily.   Cholecalciferol (VITAMIN D3) 50 MCG (2000 UT) TABS Take 1 tablet by mouth daily.   cyclobenzaprine (FLEXERIL) 10 MG tablet Take 10 mg by mouth every 8 (eight) hours as needed.   escitalopram  (LEXAPRO ) 20 MG tablet Take 1 tablet (20 mg total) by mouth daily.   eszopiclone  (LUNESTA ) 2 MG TABS tablet TAKE ONE TABLET BY MOUTH EVERYDAY AT BEDTIME AS NEEDED FOR INSOMNIA   Multiple Vitamins-Minerals (ICAPS AREDS 2 PO) Take 2 capsules by mouth daily.   Multiple Vitamins-Minerals (MULTIVITAMIN WITH MINERALS) tablet Take 1 tablet by mouth daily.   omeprazole  (PRILOSEC) 20 MG capsule Take 1 capsule (20 mg total) by mouth daily.   Probiotic Product (PROBIOTIC DAILY PO) Take 1 tablet by mouth daily.   sildenafil  (VIAGRA ) 100 MG tablet TAKE ONE TABLET BY MOUTH ONCE DAILY AS NEEDED   valACYclovir  (VALTREX ) 500 MG tablet Take 1 tablet (500 mg total) by mouth daily.   valsartan  (DIOVAN ) 80 MG tablet Take 1 tablet (80 mg total) by mouth daily.     Allergies:   Patient has no  known allergies.   Social History   Socioeconomic History   Marital status: Unknown    Spouse name: Not on file   Number of children: 1   Years of education: Not on file   Highest education level: Not on file  Occupational History   Occupation: Art therapist    Comment: [employer]  Tobacco Use   Smoking status: Never   Smokeless tobacco: Never  Vaping Use   Vaping status: Never Used  Substance and Sexual Activity   Alcohol  use: Yes    Alcohol /week: 6.0 - 8.0 standard drinks of alcohol     Types: 6 - 8 Shots of liquor per week    Comment: weekends   Drug use: No   Sexual activity: Yes  Other Topics Concern   Not on file   Social History Narrative   Married in 2022. 1 daughter from prior marriage- 52 years old in 2023- no grandkids yet.       The patient is the art therapist of [employer]: bowling, yard work, sports- [employer] scientist, research (medical), hockey enjoys hurricanes, ra[employer] football, [employer] basketball   Social Drivers of Corporate Investment Banker Strain: Not on file  Food Insecurity: Not on file  Transportation Needs: Not on file  Physical Activity: Not on file  Stress: Not on file  Social Connections: Not on file     Family History: The patient's family history includes Cancer in his father; Heart attack in his maternal grandfather, maternal grandmother, paternal grandfather, and paternal grandmother; Hypertension in his mother; Irritable bowel syndrome in his mother; Other in his mother. There is no history of Colon cancer, Stomach cancer, Esophageal cancer, Pancreatic cancer, or Rectal cancer.  ROS:   Please see the history of present illness.     All other systems reviewed and are negative.  EKGs/Labs/Other Studies Reviewed:    The following studies were reviewed today:   EKG:   05/14/23: RBBB, NSR 09/16/2023: Normal sinus rhythm, rate 69, right bundle branch block  Recent Labs: 03/30/2023: ALT 27; Hemoglobin 14.3; Platelets 172.0 06/05/2023: BUN 17; Creatinine, Ser 0.98; Potassium 4.3; Sodium 143  Recent Lipid Panel    Component Value Date/Time   CHOL 162 03/30/2023 0845   TRIG 122.0 03/30/2023 0845   HDL 49.50 03/30/2023 0845   CHOLHDL 3 03/30/2023 0845   VLDL 24.4 03/30/2023 0845   LDLCALC 88 03/30/2023 0845    Physical Exam:    VS:  BP 115/75 (BP Location: Left Arm, Patient Position: Sitting)   Pulse 69   Ht 6' (1.829 m)   Wt 209 lb 9.6 oz (95.1 kg)   SpO2 100%   BMI 28.43 kg/m     Wt Readings from Last 3 Encounters:  09/16/23 209 lb 9.6 oz (95.1 kg)  06/17/23 208 lb (94.3 kg)  06/12/23 208 lb 4 oz (94.5 kg)     GEN:  Well nourished, well developed in no acute  distress HEENT: Normal NECK: No JVD; No carotid bruits LYMPHATICS: No lymphadenopathy CARDIAC: RRR, no murmurs, rubs, gallops RESPIRATORY:  Clear to auscultation without rales, wheezing or rhonchi  ABDOMEN: Soft, non-tender, non-distended MUSCULOSKELETAL:  No edema; No deformity  SKIN: Warm and dry NEUROLOGIC:  Alert and oriented x 3 PSYCHIATRIC:  Normal affect   ASSESSMENT:    1. Coronary artery disease involving native coronary artery of native heart without angina pectoris   2. Nonspecific abnormal electrocardiogram (ECG) (EKG)   3. Essential hypertension   4. Hyperlipidemia,  unspecified hyperlipidemia type     PLAN:    CAD: Reported atypical chest pain/DOE.  Echocardiogram 06/08/2023 showed EF 55%, normal RV function, no significant valvular disease, mild dilatation of ascending aorta measuring 40 mm.  Coronary CTA 06/09/2023 showed minimal nonobstructive CAD (noncalcified plaque causing less than 25% stenosis in LAD/RCA), calcium  score 0. -Continue rosuvastatin  10 mg daily  Abnormal EKG: Right bundle branch block on EKG. no structural heart disease on echocardiogram 06/08/2023 as above  Hypertension: On valsartan  80 mg daily.  Appears controlled  Hyperlipidemia: On rosuvastatin  5 mg daily.  LDL 88 on 03/30/23.  Rosuvastatin  increased to 10 mg daily given coronary CTA results as above, check lipid panel  RTC in 1 year   Medication Adjustments/Labs and Tests Ordered: Current medicines are reviewed at length with the patient today.  Concerns regarding medicines are outlined above.  Orders Placed This Encounter  Procedures   Lipid panel   EKG 12-Lead   No orders of the defined types were placed in this encounter.   Patient Instructions  Medication Instructions:  Continue current medications *If you need a refill on your cardiac medications before your next appointment, please call your pharmacy*   Lab Work: Lipid panel today If you have labs (blood work) drawn  today and your tests are completely normal, you will receive your results only by: MyChart Message (if you have MyChart) OR A paper copy in the mail If you have any lab test that is abnormal or we need to change your treatment, we will call you to review the results.   Testing/Procedures: none   Follow-Up: At [facility], you and your health needs are our priority.  As part of our continuing mission to provide you with exceptional heart care, we have created designated Provider Care Teams.  These Care Teams include your primary Cardiologist (physician) and Advanced Practice Providers (APPs -  Physician Assistants and Nurse Practitioners) who all work together to provide you with the care you need, when you need it.  We recommend signing up for the patient portal called MyChart.  Sign up information is provided on this After Visit Summary.  MyChart is used to connect with patients for Virtual Visits (Telemedicine).  Patients are able to view lab/test results, encounter notes, upcoming appointments, etc.  Non-urgent messages can be sent to your provider as well.   To learn more about what you can do with MyChart, go to [email or web address].    Your next appointment:   1 year(s)  Provider:   Lonni LITTIE Nanas, MD     Other Instructions none         Signed, Lonni LITTIE Nanas, MD  09/16/2023 9:17 AM    [facility]

## 2023-09-16 NOTE — Patient Instructions (Signed)
 Medication Instructions:  Continue current medications *If you need a refill on your cardiac medications before your next appointment, please call your pharmacy*   Lab Work: Lipid panel today If you have labs (blood work) drawn today and your tests are completely normal, you will receive your results only by: MyChart Message (if you have MyChart) OR A paper copy in the mail If you have any lab test that is abnormal or we need to change your treatment, we will call you to review the results.   Testing/Procedures: none   Follow-Up: At [facility], you and your health needs are our priority.  As part of our continuing mission to provide you with exceptional heart care, we have created designated Provider Care Teams.  These Care Teams include your primary Cardiologist (physician) and Advanced Practice Providers (APPs -  Physician Assistants and Nurse Practitioners) who all work together to provide you with the care you need, when you need it.  We recommend signing up for the patient portal called "MyChart".  Sign up information is provided on this After Visit Summary.  MyChart is used to connect with patients for Virtual Visits (Telemedicine).  Patients are able to view lab/test results, encounter notes, upcoming appointments, etc.  Non-urgent messages can be sent to your provider as well.   To learn more about what you can do with MyChart, go to [email or web address].    Your next appointment:   1 year(s)  Provider:   Little Ishikawa, MD     Other Instructions none

## 2023-09-21 ENCOUNTER — Encounter: Payer: Self-pay | Admitting: *Deleted

## 2023-10-06 ENCOUNTER — Ambulatory Visit: Payer: BC Managed Care – PPO | Admitting: Family Medicine

## 2023-10-20 DIAGNOSIS — M7062 Trochanteric bursitis, left hip: Secondary | ICD-10-CM | POA: Diagnosis not present

## 2023-10-28 DIAGNOSIS — H2513 Age-related nuclear cataract, bilateral: Secondary | ICD-10-CM | POA: Diagnosis not present

## 2023-10-28 DIAGNOSIS — H353221 Exudative age-related macular degeneration, left eye, with active choroidal neovascularization: Secondary | ICD-10-CM | POA: Diagnosis not present

## 2023-10-28 DIAGNOSIS — H43813 Vitreous degeneration, bilateral: Secondary | ICD-10-CM | POA: Diagnosis not present

## 2023-10-28 DIAGNOSIS — H353112 Nonexudative age-related macular degeneration, right eye, intermediate dry stage: Secondary | ICD-10-CM | POA: Diagnosis not present

## 2023-11-25 ENCOUNTER — Telehealth: Payer: Self-pay

## 2023-11-25 ENCOUNTER — Other Ambulatory Visit: Payer: Self-pay | Admitting: Family Medicine

## 2023-11-25 MED ORDER — ESZOPICLONE 2 MG PO TABS: ORAL_TABLET | ORAL | 0 refills | Status: DC

## 2023-11-25 NOTE — Telephone Encounter (Signed)
 Pt requesting refill on Eszopiclone 2mg .  LOV - 03/30/23 LF - 05/20/23 #30, 5 refills  Dr Waldo Guitar can you advise in Dr Lolita Rise absence? Thanks [facility]

## 2023-11-25 NOTE — Telephone Encounter (Signed)
 Marland Kitchen

## 2023-12-01 ENCOUNTER — Other Ambulatory Visit: Payer: Self-pay

## 2023-12-01 DIAGNOSIS — J328 Other chronic sinusitis: Secondary | ICD-10-CM

## 2023-12-01 MED ORDER — LORATADINE 10 MG PO TABS: 10.0000 mg | ORAL_TABLET | Freq: Every day | ORAL | 11 refills | Status: DC

## 2023-12-30 DIAGNOSIS — H43813 Vitreous degeneration, bilateral: Secondary | ICD-10-CM | POA: Diagnosis not present

## 2023-12-30 DIAGNOSIS — H2513 Age-related nuclear cataract, bilateral: Secondary | ICD-10-CM | POA: Diagnosis not present

## 2023-12-30 DIAGNOSIS — H353221 Exudative age-related macular degeneration, left eye, with active choroidal neovascularization: Secondary | ICD-10-CM | POA: Diagnosis not present

## 2023-12-30 DIAGNOSIS — H353112 Nonexudative age-related macular degeneration, right eye, intermediate dry stage: Secondary | ICD-10-CM | POA: Diagnosis not present

## 2024-01-02 ENCOUNTER — Other Ambulatory Visit: Payer: Self-pay | Admitting: Family Medicine

## 2024-02-16 DIAGNOSIS — H43813 Vitreous degeneration, bilateral: Secondary | ICD-10-CM | POA: Diagnosis not present

## 2024-02-16 DIAGNOSIS — H353221 Exudative age-related macular degeneration, left eye, with active choroidal neovascularization: Secondary | ICD-10-CM | POA: Diagnosis not present

## 2024-02-16 DIAGNOSIS — H353112 Nonexudative age-related macular degeneration, right eye, intermediate dry stage: Secondary | ICD-10-CM | POA: Diagnosis not present

## 2024-02-16 DIAGNOSIS — H2513 Age-related nuclear cataract, bilateral: Secondary | ICD-10-CM | POA: Diagnosis not present

## 2024-03-29 DIAGNOSIS — H43393 Other vitreous opacities, bilateral: Secondary | ICD-10-CM | POA: Diagnosis not present

## 2024-03-29 DIAGNOSIS — H2513 Age-related nuclear cataract, bilateral: Secondary | ICD-10-CM | POA: Diagnosis not present

## 2024-03-29 DIAGNOSIS — H43813 Vitreous degeneration, bilateral: Secondary | ICD-10-CM | POA: Diagnosis not present

## 2024-03-29 DIAGNOSIS — H353112 Nonexudative age-related macular degeneration, right eye, intermediate dry stage: Secondary | ICD-10-CM | POA: Diagnosis not present

## 2024-03-29 DIAGNOSIS — H353221 Exudative age-related macular degeneration, left eye, with active choroidal neovascularization: Secondary | ICD-10-CM | POA: Diagnosis not present

## 2024-04-04 ENCOUNTER — Ambulatory Visit (INDEPENDENT_AMBULATORY_CARE_PROVIDER_SITE_OTHER): Payer: BC Managed Care – PPO | Admitting: Family Medicine

## 2024-04-04 ENCOUNTER — Encounter: Payer: Self-pay | Admitting: Family Medicine

## 2024-04-04 VITALS — BP 138/82 | HR 67 | Temp 97.5°F | Ht 72.0 in | Wt 212.0 lb

## 2024-04-04 DIAGNOSIS — Z0000 Encounter for general adult medical examination without abnormal findings: Secondary | ICD-10-CM

## 2024-04-04 DIAGNOSIS — E663 Overweight: Secondary | ICD-10-CM | POA: Diagnosis not present

## 2024-04-04 DIAGNOSIS — I10 Essential (primary) hypertension: Secondary | ICD-10-CM

## 2024-04-04 DIAGNOSIS — I700 Atherosclerosis of aorta: Secondary | ICD-10-CM

## 2024-04-04 DIAGNOSIS — Z131 Encounter for screening for diabetes mellitus: Secondary | ICD-10-CM

## 2024-04-04 DIAGNOSIS — Z125 Encounter for screening for malignant neoplasm of prostate: Secondary | ICD-10-CM

## 2024-04-04 LAB — LIPID PANEL
Cholesterol: 147 mg/dL (ref 0–200)
HDL: 46.6 mg/dL (ref >39.00)
LDL Cholesterol: 61 mg/dL (ref 0–99)
NonHDL: 100.56
Total CHOL/HDL Ratio: 3
Triglycerides: 200 mg/dL — ABNORMAL HIGH (ref 0.0–149.0)
VLDL: 40 mg/dL (ref 0.0–40.0)

## 2024-04-04 LAB — CBC WITH DIFFERENTIAL/PLATELET
Basophils Absolute: 0 K/uL (ref 0.0–0.1)
Basophils Relative: 0.6 % (ref 0.0–3.0)
Eosinophils Absolute: 0.2 K/uL (ref 0.0–0.7)
Eosinophils Relative: 3.6 % (ref 0.0–5.0)
HCT: 42.4 % (ref 39.0–52.0)
Hemoglobin: 14.7 g/dL (ref 13.0–17.0)
Lymphocytes Relative: 13.7 % (ref 12.0–46.0)
Lymphs Abs: 0.7 K/uL (ref 0.7–4.0)
MCHC: 34.7 g/dL (ref 30.0–36.0)
MCV: 88.6 fl (ref 78.0–100.0)
Monocytes Absolute: 0.5 K/uL (ref 0.1–1.0)
Monocytes Relative: 9.9 % (ref 3.0–12.0)
Neutro Abs: 3.6 K/uL (ref 1.4–7.7)
Neutrophils Relative %: 72.2 % (ref 43.0–77.0)
Platelets: 180 K/uL (ref 150.0–400.0)
RBC: 4.78 Mil/uL (ref 4.22–5.81)
RDW: 13.6 % (ref 11.5–15.5)
WBC: 5 K/uL (ref 4.0–10.5)

## 2024-04-04 LAB — COMPREHENSIVE METABOLIC PANEL WITH GFR
ALT: 37 U/L (ref 0–53)
AST: 24 U/L (ref 0–37)
Albumin: 4.6 g/dL (ref 3.5–5.2)
Alkaline Phosphatase: 88 U/L (ref 39–117)
BUN: 15 mg/dL (ref 6–23)
CO2: 31 meq/L (ref 19–32)
Calcium: 9.1 mg/dL (ref 8.4–10.5)
Chloride: 102 meq/L (ref 96–112)
Creatinine, Ser: 0.79 mg/dL (ref 0.40–1.50)
GFR: 93.01 mL/min (ref >60.00)
Glucose, Bld: 102 mg/dL — ABNORMAL HIGH (ref 70–99)
Potassium: 4.3 meq/L (ref 3.5–5.1)
Sodium: 143 meq/L (ref 135–145)
Total Bilirubin: 1.1 mg/dL (ref 0.2–1.2)
Total Protein: 6.7 g/dL (ref 6.0–8.3)

## 2024-04-04 LAB — PSA: PSA: 0.45 ng/mL (ref 0.10–4.00)

## 2024-04-04 LAB — HEMOGLOBIN A1C: Hgb A1c MFr Bld: 5.5 % (ref 4.6–6.5)

## 2024-04-04 MED ORDER — ESCITALOPRAM OXALATE 20 MG PO TABS: 20.0000 mg | ORAL_TABLET | Freq: Every day | ORAL | 3 refills | Status: AC

## 2024-04-04 MED ORDER — VALSARTAN 80 MG PO TABS: 80.0000 mg | ORAL_TABLET | Freq: Every day | ORAL | 3 refills | Status: DC

## 2024-04-04 MED ORDER — VALACYCLOVIR HCL 500 MG PO TABS: 500.0000 mg | ORAL_TABLET | Freq: Every day | ORAL | 3 refills | Status: AC

## 2024-04-04 MED ORDER — OMEPRAZOLE 20 MG PO CPDR: 20.0000 mg | DELAYED_RELEASE_CAPSULE | Freq: Every day | ORAL | 3 refills | Status: AC

## 2024-04-04 MED ORDER — BUSPIRONE HCL 5 MG PO TABS: 5.0000 mg | ORAL_TABLET | Freq: Three times a day (TID) | ORAL | 3 refills | Status: AC

## 2024-04-04 NOTE — Progress Notes (Signed)
 Phone: [phone]   Subjective:  Patient presents today for their annual physical. Chief complaint-noted.   See problem oriented charting- ROS- full  review of systems was completed and negative   The following were reviewed and entered/updated in epic: Past Medical History:  Diagnosis Date   Anxiety    Arthritis    OA   GERD (gastroesophageal reflux disease)    Grover's disease    has seen Dr. Cary   IBS (irritable bowel syndrome)    MRSA (methicillin resistant staph aureus) culture positive    hx of due to ingrown hair    10  yrs ago   PONV (postoperative nausea and vomiting)    Tubular adenoma 01/28/2021   Patient Active Problem List   Diagnosis Date Noted   Essential hypertension 03/30/2023    Priority: Medium    Macular degeneration 03/27/2022    Priority: Medium    Aortic atherosclerosis (HCC) 09/24/2021    Priority: Medium    GAD (generalized anxiety disorder) 09/24/2021    Priority: Medium    Primary insomnia 09/24/2021    Priority: Medium    Small intestinal bacterial overgrowth (SIBO) ? 06/11/2021    Priority: Medium    Shingles 11/26/2021    Priority: Low   History of adenomatous polyp of colon 09/24/2021    Priority: Low   Genital herpes 09/24/2021    Priority: Low   IBS (irritable bowel syndrome) 01/16/2021    Priority: Low   Primary osteoarthritis of right knee 02/06/2016    Priority: Low   Solitary pulmonary nodule 02/06/2016    Priority: Low   Past Surgical History:  Procedure Laterality Date   APPENDECTOMY     COLONOSCOPY  2013   Negative screening exam at [facility]   COLONOSCOPY W/ BIOPSIES  01/28/2021   tubular adenoma   KNEE SURGERY Right    2 surgeries prior to replacement on meniscus   left shoulder repair Left 06/2018   Dr. Yvone   TOTAL KNEE ARTHROPLASTY Right 02/06/2016   Dr. Yvone. Procedure: TOTAL KNEE ARTHROPLASTY;  Surgeon: Norleen Yvone, MD;  Location: [facility] OR;  Service: Orthopedics;  Laterality: Right;    Family History   Problem Relation Age of Onset   Hypertension Mother    Irritable bowel syndrome Mother    Other Mother        pagets skin   Cancer Father        died within 67 days- unclear original cause- several mets   Heart attack Maternal Grandmother    Heart attack Maternal Grandfather    Heart attack Paternal Grandmother    Heart attack Paternal Grandfather    Colon cancer Neg Hx    Stomach cancer Neg Hx    Esophageal cancer Neg Hx    Pancreatic cancer Neg Hx    Rectal cancer Neg Hx     Medications- reviewed and updated Current Outpatient Medications  Medication Sig Dispense Refill   ascorbic acid (VITAMIN C) 1000 MG tablet Take 2,000 mg by mouth daily.     busPIRone  (BUSPAR ) 5 MG tablet Take 1 tablet (5 mg total) by mouth 3 (three) times daily. 270 tablet 3   Cholecalciferol (VITAMIN D3) 50 MCG (2000 UT) TABS Take 1 tablet by mouth daily.     cyclobenzaprine (FLEXERIL) 10 MG tablet Take 10 mg by mouth every 8 (eight) hours as needed.     escitalopram  (LEXAPRO ) 20 MG tablet Take 1 tablet (20 mg total) by mouth daily. 90 tablet 3  eszopiclone  (LUNESTA ) 2 MG TABS tablet TAKE 1 TABLET BY MOUTH EVERY DAY AT BEDTIME AS NEEDED FOR INSOMNIA 30 tablet 5   loratadine  (CLARITIN ) 10 MG tablet Take 1 tablet (10 mg total) by mouth daily. 30 tablet 11   Multiple Vitamins-Minerals (ICAPS AREDS 2 PO) Take 2 capsules by mouth daily.     Multiple Vitamins-Minerals (MULTIVITAMIN WITH MINERALS) tablet Take 1 tablet by mouth daily.     omeprazole  (PRILOSEC) 20 MG capsule Take 1 capsule (20 mg total) by mouth daily. 90 capsule 3   Probiotic Product (PROBIOTIC DAILY PO) Take 1 tablet by mouth daily.     rosuvastatin  (CRESTOR ) 10 MG tablet Take 1 tablet (10 mg total) by mouth daily. 90 tablet 3   sildenafil (VIAGRA) 100 MG tablet TAKE ONE TABLET BY MOUTH ONCE DAILY AS NEEDED 148 tablet 0   valACYclovir  (VALTREX ) 500 MG tablet Take 1 tablet (500 mg total) by mouth daily. 90 tablet 3   valsartan  (DIOVAN ) 80 MG  tablet Take 1 tablet (80 mg total) by mouth daily. 90 tablet 3   No current facility-administered medications for this visit.    Allergies-reviewed and updated No Known Allergies  Social History   Social History Narrative   Married in 2022. 1 daughter from prior marriage- 16 years old in 2023- no grandkids yet.       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 Oropharynx normal Neck: no thyromegaly CV: RRR no murmurs rubs or gallops Lungs: CTAB no crackles, wheeze, rhonchi Abdomen: soft/nontender/nondistended/normal bowel sounds. No rebound or guarding.  Ext: no edema Skin: warm, dry Neuro: grossly normal, moves all extremities, PERRLA Declines genitourinary and rectal- no concerns    Assessment and Plan  68 y.o. male presenting for annual physical.  Health Maintenance counseling: 1. Anticipatory guidance: Patient counseled regarding regular dental exams -q6 months, eye exams - very regularly,  avoiding smoking and second hand smoke , limiting alcohol  to 2 beverages per day - 5-6 per week, no illicit drugs .   2. Risk factor reduction:  Advised patient of need for regular exercise and diet rich and fruits and vegetables to reduce risk of heart attack and stroke.  Exercise- gardening and aggressive yard work, hip replacemed has slowed him some- still walking 3-5 miles per day. Has upcoming vsiit with Dr. Yvone.  Diet/weight management-within 2 lbs of last year despite hip replacement and stress levels. He has still preferred to be around 200- wants to work towards  that.  Wt Readings from Last 3 Encounters:  04/04/24 212 lb (96.2 kg)  09/16/23 209 lb 9.6 oz (95.1 kg)  06/17/23 208 lb (94.3 kg)  3. Immunizations/screenings/ancillary studies- declines flu and COVID shot. Prevnar 20 in future- holding off for now  Immunization History  Administered Date(s) Administered   Janssen (J&J) SARS-COV-2 Vaccination 10/12/2019   Tdap 03/27/2022   Zoster Recombinant(Shingrix) 10/13/2018, 04/12/2019  4. Prostate cancer screening- prior PSA range with [facility] in 0.5-1.2 range - on our checks has been low risk  as well- update today  Lab Results  Component Value Date   PSA 1.09 03/30/2023   PSA 0.85 03/27/2022   5. Colon cancer screening - January 28, 2021 with 7 year repeat 6. Skin cancer screening- sees dermatology yearly. advised regular sunscreen use. Denies worrisome, changing, or new skin lesions.  7. Smoking associated screening (lung cancer screening, AAA screen 65-75, UA)- never smoker 8. STD screening - only active with wife  Status of chronic or acute concerns   #social update- work very stressful- one of his key people is gone.  Feels like he has hit a wall- hoping to retire in 2 years. Still trying to sell big house- a lot of stress. Stress caring for mom. Not able to bowl due to the hip anymore which is tough.   #Neck stiffness- still considering surgical fusion with Dr. Colon- tough decision. Doing massage therapy and working with physical therapy. Flexeril available as needed.   #some lightheadedness with bending over. Lasts a few seconds. Not hydrating well. Maybe 4-5 glasses a day- encouraged pushing to 6 but tough on stomach. Did not do well with 8.   # Anxiety # Insomnia S:Medication: Escitalopram  20 mg,Buspirone  5 mg 3 times a day, Lunesta  2 mg Counseling: feels doesn't have time    04/04/2024    8:04 AM 03/30/2023    8:03 AM 03/27/2022   11:04 AM 11/26/2021    3:50 PM  GAD 7 : Generalized Anxiety Score  Nervous, Anxious, on Edge 0  0 0 2  Control/stop worrying 2 0 0 2  Worry too much - different things 2 1 2 1   Trouble relaxing 2 0 1 1  Restless 0 0 1 0  Easily annoyed or irritable 2 0 0 2  Afraid - awful might happen 0 0 0 0  Total GAD 7 Score 8 1 4 8   Anxiety Difficulty Somewhat difficult Not difficult at all Not difficult at all Somewhat difficult  A/P: situationally really tough with work situation- he still finds medicines helpful. Time is thin for therapy. Doesn't feel situation bad enough for psychiatry- wants to hold steady.    #hypertension S: medication: Valsartan  80 mg BP Readings from Last 3 Encounters:  04/04/24 138/82  09/16/23 115/75  06/17/23 (!) 142/90  A/P: high acceptable - continue current medications    # Coronary artery disease-originally referred for chest pain July 2024 with reassuring ejection fraction, mild dilation of ascending aorta measuring 40 mm, coronary CTA showing minimal nonobstructive CAD less than 25% stenosis in LAD/RCA #hyperlipidemia S: Medication:Rosuvastatin  10 mg daily Lab Results  Component Value Date   CHOL 143 09/16/2023   HDL 54 09/16/2023   LDLCALC 72 09/16/2023   TRIG 90 09/16/2023   CHOLHDL 2.6 09/16/2023   A/P: CAD nonobstructive- continue current medications - cardiology has not recommended aspirin   Lipids- wants to make sure stable- close to ideal goal 70 or less last time  # Macular degeneration on AREDS - sees optho regularly q6 weeks and getting shots    # Erectile dysfunction-sildenafil available as needed   # Genital herpes-long-term suppression with Valtrex  500 mg-no flares on this   #IBS- tolerable. Tries to avoid certain foods  Recommended follow up: Return in about 1 year (around 04/04/2025) for physical or sooner if needed.Schedule b4 you leave.  Lab/Order associations: fasting   ICD-10-CM   1. Preventative health care  Z00.00     2. Essential hypertension  I10     3. Aortic atherosclerosis (HCC)  I70.0     4. Screening for prostate  cancer  Z12.5       No orders of the defined types were placed in this encounter.   Return precautions advised.  Garnette Lukes, MD

## 2024-04-04 NOTE — Patient Instructions (Addendum)
 Please stop by lab before you go If you have mychart- we will send your results within 3 business days of us  receiving them.  If you do not have mychart- we will call you about results within 5 business days of us  receiving them.  *please also note that you will see labs on mychart as soon as they post. I will later go in and write notes on them- will say notes from Dr. Katrinka   Begin to develop what your next steps are when you retire!   Also try to find an outlet that works for you since cannot bowl right now  Recommended follow up: Return in about 1 year (around 04/04/2025) for physical or sooner if needed.Schedule b4 you leave.

## 2024-04-05 ENCOUNTER — Ambulatory Visit: Payer: Self-pay | Admitting: Family Medicine

## 2024-05-10 DIAGNOSIS — H353112 Nonexudative age-related macular degeneration, right eye, intermediate dry stage: Secondary | ICD-10-CM | POA: Diagnosis not present

## 2024-05-10 DIAGNOSIS — H2513 Age-related nuclear cataract, bilateral: Secondary | ICD-10-CM | POA: Diagnosis not present

## 2024-05-10 DIAGNOSIS — H43813 Vitreous degeneration, bilateral: Secondary | ICD-10-CM | POA: Diagnosis not present

## 2024-05-10 DIAGNOSIS — H43393 Other vitreous opacities, bilateral: Secondary | ICD-10-CM | POA: Diagnosis not present

## 2024-05-10 DIAGNOSIS — H353221 Exudative age-related macular degeneration, left eye, with active choroidal neovascularization: Secondary | ICD-10-CM | POA: Diagnosis not present

## 2024-05-12 ENCOUNTER — Other Ambulatory Visit: Payer: Self-pay | Admitting: Family Medicine

## 2024-05-13 ENCOUNTER — Other Ambulatory Visit: Payer: Self-pay | Admitting: Family Medicine

## 2024-05-13 MED ORDER — SILDENAFIL CITRATE 100 MG PO TABS: 100.0000 mg | ORAL_TABLET | ORAL | 0 refills | Status: AC | PRN

## 2024-05-13 NOTE — Telephone Encounter (Signed)
 Copied from CRM #[id number]. Topic: Clinical - Medication Refill >> May 13, 2024  3:32 PM Mercedes MATSU wrote: Medication:  sildenafil (VIAGRA) 100 MG tablet   Has the patient contacted their pharmacy? Yes (Agent: If no, request that the patient contact the pharmacy for the refill. If patient does not wish to contact the pharmacy document the reason why and proceed with request.) (Agent: If yes, when and what did the pharmacy advise?)  This is the patient's preferred pharmacy:  [id number] - [place], [place] - [address] AT [facility] [address] [place] [address] Phone: [phone] Fax: [phone]  Is this the correct pharmacy for this prescription? Yes If no, delete pharmacy and type the correct one.   Has the prescription been filled recently? Yes  Is the patient out of the medication? Yes  Has the patient been seen for an appointment in the last year OR does the patient have an upcoming appointment? Yes  Can we respond through MyChart? Yes  Agent: Please be advised that Rx refills may take up to 3 business days. We ask that you follow-up with your pharmacy.

## 2024-06-15 DIAGNOSIS — Z6828 Body mass index (BMI) 28.0-28.9, adult: Secondary | ICD-10-CM | POA: Diagnosis not present

## 2024-06-15 DIAGNOSIS — G959 Disease of spinal cord, unspecified: Secondary | ICD-10-CM | POA: Diagnosis not present

## 2024-06-29 DIAGNOSIS — H43813 Vitreous degeneration, bilateral: Secondary | ICD-10-CM | POA: Diagnosis not present

## 2024-06-29 DIAGNOSIS — H43393 Other vitreous opacities, bilateral: Secondary | ICD-10-CM | POA: Diagnosis not present

## 2024-06-29 DIAGNOSIS — H353112 Nonexudative age-related macular degeneration, right eye, intermediate dry stage: Secondary | ICD-10-CM | POA: Diagnosis not present

## 2024-06-29 DIAGNOSIS — H2513 Age-related nuclear cataract, bilateral: Secondary | ICD-10-CM | POA: Diagnosis not present

## 2024-06-29 DIAGNOSIS — H353221 Exudative age-related macular degeneration, left eye, with active choroidal neovascularization: Secondary | ICD-10-CM | POA: Diagnosis not present

## 2024-06-30 DIAGNOSIS — K1329 Other disturbances of oral epithelium, including tongue: Secondary | ICD-10-CM | POA: Diagnosis not present

## 2024-06-30 DIAGNOSIS — K1239 Other oral mucositis (ulcerative): Secondary | ICD-10-CM | POA: Diagnosis not present

## 2024-06-30 DIAGNOSIS — K1321 Leukoplakia of oral mucosa, including tongue: Secondary | ICD-10-CM | POA: Diagnosis not present

## 2024-06-30 DIAGNOSIS — G959 Disease of spinal cord, unspecified: Secondary | ICD-10-CM | POA: Diagnosis not present

## 2024-06-30 DIAGNOSIS — K136 Irritative hyperplasia of oral mucosa: Secondary | ICD-10-CM | POA: Diagnosis not present

## 2024-07-19 ENCOUNTER — Other Ambulatory Visit: Payer: Self-pay | Admitting: Cardiology

## 2024-07-20 ENCOUNTER — Encounter: Payer: Self-pay | Admitting: Family Medicine

## 2024-07-22 ENCOUNTER — Ambulatory Visit: Admitting: Family Medicine

## 2024-07-22 ENCOUNTER — Encounter: Payer: Self-pay | Admitting: Family Medicine

## 2024-07-22 VITALS — BP 122/76 | HR 72 | Temp 97.7°F | Ht 72.0 in | Wt 218.0 lb

## 2024-07-22 DIAGNOSIS — L0291 Cutaneous abscess, unspecified: Secondary | ICD-10-CM | POA: Diagnosis not present

## 2024-07-22 DIAGNOSIS — I10 Essential (primary) hypertension: Secondary | ICD-10-CM | POA: Diagnosis not present

## 2024-07-22 MED ORDER — DOXYCYCLINE HYCLATE 100 MG PO TABS: 100.0000 mg | ORAL_TABLET | Freq: Two times a day (BID) | ORAL | 0 refills | Status: AC

## 2024-07-22 NOTE — Patient Instructions (Addendum)
 It looks like this is going in the right direction drying up and slightly smaller- you are still tender and area is a little warm so we opted to give an additional 7 days of doxycycline . We considered bactrim and clindamycin but with how well you tolerated doxycycline  we opted to hold steady on that with improvement.   Wish you the best on your upcoming surgery!   Recommended follow up: Return for as needed for new, worsening, persistent symptoms.

## 2024-07-22 NOTE — Progress Notes (Signed)
 Phone [phone] In person visit   Subjective:   Micheal May is a 68 y.o. year old very pleasant male patient who presents for/with See problem oriented charting Chief Complaint  Patient presents with   Cyst    Very small cyst/knots behind the knees for the 4-5 months. Woke 10 days ago an one on the right knee opened up. Has a Hx of MRSA.  Has taken a few days of antibiotics that he was given from a friend. Thinks that it has helped. Has a neck fusion in 25 days. Want to have this cleared before surgery.     Past Medical History-  Patient Active Problem List   Diagnosis Date Noted   Essential hypertension 03/30/2023    Priority: Medium    Macular degeneration 03/27/2022    Priority: Medium    Aortic atherosclerosis 09/24/2021    Priority: Medium    GAD (generalized anxiety disorder) 09/24/2021    Priority: Medium    Primary insomnia 09/24/2021    Priority: Medium    Small intestinal bacterial overgrowth (SIBO) ? 06/11/2021    Priority: Medium    Shingles 11/26/2021    Priority: Low   History of adenomatous polyp of colon 09/24/2021    Priority: Low   Genital herpes 09/24/2021    Priority: Low   IBS (irritable bowel syndrome) 01/16/2021    Priority: Low   Primary osteoarthritis of right knee 02/06/2016    Priority: Low   Solitary pulmonary nodule 02/06/2016    Priority: Low    Medications- reviewed and updated Current Outpatient Medications  Medication Sig Dispense Refill   ascorbic acid (VITAMIN C) 1000 MG tablet Take 2,000 mg by mouth daily.     busPIRone  (BUSPAR ) 5 MG tablet Take 1 tablet (5 mg total) by mouth 3 (three) times daily. 270 tablet 3   Cholecalciferol (VITAMIN D3) 50 MCG (2000 UT) TABS Take 1 tablet by mouth daily.     escitalopram  (LEXAPRO ) 20 MG tablet Take 1 tablet (20 mg total) by mouth daily. 90 tablet 3   eszopiclone  (LUNESTA ) 2 MG TABS tablet TAKE 1 TABLET BY MOUTH EVERY DAY AT BEDTIME AS NEEDED FOR INSOMNIA 30 tablet 5   Multiple  Vitamins-Minerals (MULTIVITAMIN WITH MINERALS) tablet Take 1 tablet by mouth daily.     omeprazole  (PRILOSEC) 20 MG capsule Take 1 capsule (20 mg total) by mouth daily. 90 capsule 3   Probiotic Product (PROBIOTIC DAILY PO) Take 1 tablet by mouth daily.     rosuvastatin  (CRESTOR ) 10 MG tablet TAKE 1 TABLET(10 MG) BY MOUTH DAILY 90 tablet 0   sildenafil  (VIAGRA ) 100 MG tablet Take 1 tablet (100 mg total) by mouth as needed for erectile dysfunction. 148 tablet 0   valACYclovir  (VALTREX ) 500 MG tablet Take 1 tablet (500 mg total) by mouth daily. 90 tablet 3   valsartan  (DIOVAN ) 80 MG tablet TAKE 1 TABLET(80 MG) BY MOUTH DAILY 90 tablet 3   No current facility-administered medications for this visit.     Objective:  BP 122/76 (BP Location: Left Arm, Patient Position: Sitting, Cuff Size: Normal)   Pulse 72   Temp 97.7 F (36.5 C) (Temporal)   Ht 6' (1.829 m)   Wt 218 lb (98.9 kg)   SpO2 97%   BMI 29.57 kg/m  Gen: NAD, resting comfortably CV: RRR no murmurs rubs or gallops Lungs: CTAB no crackles, wheeze, rhonchi Ext: no edema Skin: warm, dry, behind right knee 1 x 1 cm scabbed over erythematous raised area- suspect  healing abscess. Mildly tender-no fluctuance noted    Assessment and Plan   # Abscess S: Patient has a close friend who is a retired armed forces operational officer Dr. Lomax.  She has been concerned he may have a staph infection behind his right knee.  He had MRSA about 18 years ago.  He had been on doxycycline  for 5 days from the friend and had noted improvement and area drying up and a scab forming.b  He has a cervical fusion surgery scheduled January 6 and he wanted to be cautious before that time  He reports that areas had very small cysts/knots the last 4 to 5 months.  10 days ago 1 behind the right knee seem to open up. A/P: Patient appears to have small abscess behind the right knee that has already had improvement on doxycycline  100 mg twice daily for 5 days.  It does not appear fully  healed to me and still somewhat tender and warm we discussed several treatment options including clindamycin, Bactrim or extending doxycycline .  In the end with reasonable cure rates on doxycycline  and his tolerance of the medication we opted for 7 more days of this though we discussed transitioning to Bactrim if fails to improve or worsens.  I do suspect this is MRSA related and I asked him to inform his surgeon of treatment for this.  There is no fluctuant area in the abscess for us  to drain so we did not complete incision and drainage     #hypertension S: medication: Valsartan  80 mg A/P: Blood pressure is well-controlled-continue current medication   Recommended follow up: Return for as needed for new, worsening, persistent symptoms. Future Appointments  Date Time Provider Department Center  04/06/2025  8:00 AM Katrinka Garnette KIDD, MD [facility]    Lab/Order associations:   ICD-10-CM   1. Abscess  L02.91     2. Essential hypertension  I10       Meds ordered this encounter  Medications   doxycycline  (VIBRA -TABS) 100 MG tablet    Sig: Take 1 tablet (100 mg total) by mouth 2 (two) times daily for 7 days.    Dispense:  14 tablet    Refill:  0    Return precautions advised.  Garnette Katrinka, MD

## 2024-07-31 ENCOUNTER — Other Ambulatory Visit: Payer: Self-pay | Admitting: Family Medicine

## 2024-09-07 ENCOUNTER — Other Ambulatory Visit: Payer: Self-pay

## 2024-09-07 NOTE — Telephone Encounter (Signed)
 Copied from CRM [id number]. Topic: Clinical - Medication Refill >> Sep 07, 2024  3:26 PM Nessti S wrote: Medication: eszopiclone  (LUNESTA ) 2 MG TABS tablet   Has the patient contacted their pharmacy? Yes (Agent: If no, request that the patient contact the pharmacy for the refill. If patient does not wish to contact the pharmacy document the reason why and proceed with request.) (Agent: If yes, when and what did the pharmacy advise?)  This is the patient's preferred pharmacy:  [facility] [place], [place] - [address] [place] [address] Phone: [phone] Fax: [phone]   Is this the correct pharmacy for this prescription? Yes If no, delete pharmacy and type the correct one.   Has the prescription been filled recently? No  Is the patient out of the medication? Yes  Has the patient been seen for an appointment in the last year OR does the patient have an upcoming appointment? Yes  Can we respond through MyChart? Yes  Agent: Please be advised that Rx refills may take up to 3 business days. We ask that you follow-up with your pharmacy.

## 2024-09-08 ENCOUNTER — Telehealth: Payer: Self-pay | Admitting: Family Medicine

## 2024-09-08 ENCOUNTER — Other Ambulatory Visit: Payer: Self-pay

## 2024-09-08 ENCOUNTER — Other Ambulatory Visit: Payer: Self-pay | Admitting: Family Medicine

## 2024-09-08 NOTE — Telephone Encounter (Signed)
 Copied from CRM [id number]. Topic: Clinical - Medication Refill >> Sep 07, 2024  3:26 PM Nessti S wrote: Medication: eszopiclone  (LUNESTA ) 2 MG TABS tablet   Has the patient contacted their pharmacy? Yes (Agent: If no, request that the patient contact the pharmacy for the refill. If patient does not wish to contact the pharmacy document the reason why and proceed with request.) (Agent: If yes, when and what did the pharmacy advise?)  This is the patient's preferred pharmacy:  [facility] [place], [place] - [address] [place] [address] Phone: [phone] Fax: [phone]   Is this the correct pharmacy for this prescription? Yes If no, delete pharmacy and type the correct one.   Has the prescription been filled recently? No  Is the patient out of the medication? Yes  Has the patient been seen for an appointment in the last year OR does the patient have an upcoming appointment? Yes  Can we respond through MyChart? Yes  Agent: Please be advised that Rx refills may take up to 3 business days. We ask that you follow-up with your pharmacy.

## 2024-09-08 NOTE — Telephone Encounter (Signed)
 Called pt and informed him that we received OV notes from [facility] for 09/07/24 OV, but his name was misspelled. Due to this being a potential record keeping issue, we attempted to call [facility] but they informed us  patient would need to call and make the change. Informed pt on VM that he can call [phone] and ask for registration in order to correct this.

## 2025-04-06 ENCOUNTER — Encounter: Admitting: Family Medicine
